# Patient Record
Sex: Female | Born: 1990 | Race: White | Hispanic: No | Marital: Single | Smoking: Never smoker
Health system: Southern US, Community
[De-identification: ages and names within clinical notes are randomized; demographics above are authoritative.]

## PROBLEM LIST (undated history)

## (undated) ENCOUNTER — Inpatient Hospital Stay: Payer: Self-pay

## (undated) DIAGNOSIS — O24419 Gestational diabetes mellitus in pregnancy, unspecified control: Secondary | ICD-10-CM

---
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
## 2014-03-20 LAB — OB RESULTS CONSOLE GBS
GBS: NEGATIVE
GBS: NEGATIVE
GBS: NEGATIVE
STREP GROUP B AG: NEGATIVE

## 2014-08-13 LAB — OB RESULTS CONSOLE GC/CHLAMYDIA
CHLAMYDIA, DNA PROBE: NEGATIVE
Gonorrhea: NEGATIVE

## 2014-08-13 LAB — OB RESULTS CONSOLE RUBELLA ANTIBODY, IGM: Rubella: IMMUNE

## 2014-08-13 LAB — OB RESULTS CONSOLE RPR: RPR: NONREACTIVE

## 2014-08-13 LAB — OB RESULTS CONSOLE HIV ANTIBODY (ROUTINE TESTING): HIV: NONREACTIVE

## 2014-08-13 LAB — OB RESULTS CONSOLE HEPATITIS B SURFACE ANTIGEN: Hepatitis B Surface Ag: NEGATIVE

## 2014-08-13 LAB — OB RESULTS CONSOLE VARICELLA ZOSTER ANTIBODY, IGG: VARICELLA IGG: IMMUNE

## 2015-01-19 LAB — OB RESULTS CONSOLE HIV ANTIBODY (ROUTINE TESTING): HIV: NONREACTIVE

## 2015-01-19 LAB — OB RESULTS CONSOLE RPR: RPR: NONREACTIVE

## 2015-01-24 ENCOUNTER — Observation Stay
Admission: EM | Admit: 2015-01-24 | Discharge: 2015-01-24 | Disposition: A | Discharge status: Home / Self Care | Payer: Medicaid Other | Attending: Obstetrics and Gynecology | Admitting: Obstetrics and Gynecology

## 2015-01-24 DIAGNOSIS — Z3A30 30 weeks gestation of pregnancy: Secondary | ICD-10-CM | POA: Insufficient documentation

## 2015-01-24 DIAGNOSIS — O42913 Preterm premature rupture of membranes, unspecified as to length of time between rupture and onset of labor, third trimester: Secondary | ICD-10-CM | POA: Diagnosis not present

## 2015-01-24 NOTE — OB Triage Note (Signed)
Pt arrives with c/o leaking "sticky fluid" since Thursday eve. Denies bleeding, pain.

## 2015-01-24 NOTE — Discharge Summary (Signed)
Obstetric History and Physical  Tara Romero is a 24 y.o. G1P0 with Estimated Date of Delivery: 04/04/15 per LMP and 8 wk US who presents at 9846w0d  presenting for leaking fluid for 4 days. Reports no large gush of fluid, but her underwear continuously getting wet. Denies vaginal itching or odor. Patient states she has been having some cramping, denies strong contractions,  no vaginal bleeding, with active fetal movement.    Prenatal Course Source of Care: [facility]  with onset of care at 6 weeks Pregnancy complications or risks: Patient Active Problem List   Diagnosis Date Noted  . Labor and delivery indication for care or intervention 01/24/2015     Prenatal labs and studies: ABO, Rh: AB+/RI/VI   Prenatal Transfer Tool   No past medical history on file.  No past surgical history on file.  OB History  Gravida Para Term Preterm AB SAB TAB Ectopic Multiple Living  1             # Outcome Date GA Lbr Len/2nd Weight Sex Delivery Anes PTL Lv  1 Current               History   Social History  . Marital Status: Single    Spouse Name: N/A  . Number of Children: N/A  . Years of Education: N/A   Social History Main Topics  . Smoking status: denies  . Smokeless tobacco: Not on file  . Alcohol Use: denies  . Drug Use: denies  . Sexual Activity: Not on file     No family history on file.  Prescriptions prior to admission  Medication Sig Dispense Refill Last Dose  . Prenatal Vit-Fe Fumarate-FA (MULTIVITAMIN-PRENATAL) 27-0.8 MG TABS tablet Take 1 tablet by mouth daily at 12 noon.   01/23/2015 at Unknown time    No Known Allergies  Review of Systems: Negative except for what is mentioned in HPI.  Physical Exam: BP 115/59 mmHg  Pulse 89  Temp(Src) 97.8 F (36.6 C) (Oral)  Resp 18  LMP 06/28/2014 GENERAL: Well-developed, well-nourished female in no acute distress.  ABDOMEN: Soft, nontender, nondistended, gravid. EXTREMITIES: Nontender, no edema Cervical Exam: Dilatation  1 cm   Effacement 30 %   Station -3   PELVIC EXAM: negative for ferning, nitrazine or pooling, wet mount + yeast, adherent curd-like discharge noted in vaginal vault Presentation: cephalic FHT: Category:1 Baseline rate 135 bpm   Variability moderate  Accelerations present   Decelerations none Contractions: rare   Pertinent Labs/Studies:   No results found for this or any previous visit (from the past 24 hour(s)).  Assessment : IUP at 6846w0d, no evidence of PPROM, yeast vaginitis  Plan: Rx for Terazol 3 given Preterm labor precautions. Given absence of contractions on fetal monitor during evaluation, low suspicion of preterm labor. Will have pt f/u at office this week. Encouraged hydration. Rec pelvic rest x 1 week at least. 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  Discharge Home

## 2015-03-19 LAB — OB RESULTS CONSOLE GBS: GBS: NEGATIVE

## 2015-04-08 ENCOUNTER — Inpatient Hospital Stay: Payer: Medicaid Other | Admitting: Anesthesiology

## 2015-04-08 ENCOUNTER — Inpatient Hospital Stay
Admission: EM | Admit: 2015-04-08 | Discharge: 2015-04-10 | DRG: 765 | Disposition: A | Discharge status: Home / Self Care | Payer: Medicaid Other | Attending: Obstetrics and Gynecology | Admitting: Obstetrics and Gynecology

## 2015-04-08 ENCOUNTER — Encounter
Admission: EM | Disposition: A | Discharge status: Home / Self Care | Payer: Self-pay | Source: Home / Self Care | Attending: Obstetrics and Gynecology

## 2015-04-08 DIAGNOSIS — Z3A40 40 weeks gestation of pregnancy: Secondary | ICD-10-CM | POA: Diagnosis present

## 2015-04-08 DIAGNOSIS — O9081 Anemia of the puerperium: Secondary | ICD-10-CM | POA: Diagnosis not present

## 2015-04-08 DIAGNOSIS — O480 Post-term pregnancy: Secondary | ICD-10-CM | POA: Diagnosis present

## 2015-04-08 DIAGNOSIS — O163 Unspecified maternal hypertension, third trimester: Secondary | ICD-10-CM | POA: Diagnosis present

## 2015-04-08 DIAGNOSIS — O133 Gestational [pregnancy-induced] hypertension without significant proteinuria, third trimester: Secondary | ICD-10-CM | POA: Diagnosis present

## 2015-04-08 DIAGNOSIS — D62 Acute posthemorrhagic anemia: Secondary | ICD-10-CM | POA: Diagnosis not present

## 2015-04-08 LAB — COMPREHENSIVE METABOLIC PANEL
ALBUMIN: 3.3 g/dL — AB (ref 3.5–5.0)
ALK PHOS: 126 U/L (ref 38–126)
ALT: 18 U/L (ref 14–54)
AST: 30 U/L (ref 15–41)
Anion gap: 9 (ref 5–15)
BILIRUBIN TOTAL: 0.4 mg/dL (ref 0.3–1.2)
BUN: 7 mg/dL (ref 6–20)
CO2: 22 mmol/L (ref 22–32)
CREATININE: 0.5 mg/dL (ref 0.44–1.00)
Calcium: 9 mg/dL (ref 8.9–10.3)
Chloride: 108 mmol/L (ref 101–111)
GFR calc Af Amer: >60 mL/min (ref >60)
Glucose, Bld: 83 mg/dL (ref 65–99)
POTASSIUM: 3.7 mmol/L (ref 3.5–5.1)
Sodium: 139 mmol/L (ref 135–145)
TOTAL PROTEIN: 6.8 g/dL (ref 6.5–8.1)

## 2015-04-08 LAB — CBC
HCT: 38.5 % (ref 35.0–47.0)
Hemoglobin: 13.3 g/dL (ref 12.0–16.0)
MCH: 31.2 pg (ref 26.0–34.0)
MCHC: 34.7 g/dL (ref 32.0–36.0)
MCV: 89.9 fL (ref 80.0–100.0)
PLATELETS: 151 10*3/uL (ref 150–440)
RBC: 4.28 MIL/uL (ref 3.80–5.20)
RDW: 12.4 % (ref 11.5–14.5)
WBC: 16.4 10*3/uL — ABNORMAL HIGH (ref 3.6–11.0)

## 2015-04-08 LAB — PROTEIN / CREATININE RATIO, URINE
Creatinine, Urine: 77 mg/dL
Protein Creatinine Ratio: 0.09 mg/mg{Cre} (ref 0.00–0.15)
Total Protein, Urine: 7 mg/dL

## 2015-04-08 LAB — TYPE AND SCREEN
ABO/RH(D): AB POS
Antibody Screen: NEGATIVE

## 2015-04-08 LAB — ABO/RH: ABO/RH(D): AB POS

## 2015-04-08 SURGERY — Surgical Case
Anesthesia: Epidural | Wound class: Clean Contaminated

## 2015-04-08 MED ORDER — DEXTROSE 5 % IV SOLN
1.0000 ug/kg/h | INTRAVENOUS | Status: DC | PRN
  Filled 2015-04-08: qty 2

## 2015-04-08 MED ORDER — ACETAMINOPHEN 325 MG PO TABS: 650.0000 mg | ORAL_TABLET | ORAL | Status: DC | PRN

## 2015-04-08 MED ORDER — OXYTOCIN 40 UNITS IN LACTATED RINGERS INFUSION - SIMPLE MED: 62.5000 mL/h | INTRAVENOUS | Status: DC

## 2015-04-08 MED ORDER — BUPIVACAINE HCL (PF) 0.5 % IJ SOLN
INTRAMUSCULAR | Status: AC
  Filled 2015-04-08: qty 30

## 2015-04-08 MED ORDER — DIPHENHYDRAMINE HCL 25 MG PO CAPS: 25.0000 mg | ORAL_CAPSULE | ORAL | Status: DC | PRN

## 2015-04-08 MED ORDER — SODIUM CHLORIDE 0.9 % IV SOLN
10000.0000 ug | INTRAVENOUS | Status: DC | PRN
  Administered 2015-04-08: 100 ug via INTRAVENOUS

## 2015-04-08 MED ORDER — MEPERIDINE HCL 25 MG/ML IJ SOLN
6.2500 mg | INTRAMUSCULAR | Status: DC | PRN
  Administered 2015-04-08: 6.25 mg via INTRAVENOUS

## 2015-04-08 MED ORDER — BUPIVACAINE HCL 0.5 % IJ SOLN
10.0000 mL | Freq: Once | INTRAMUSCULAR | Status: DC
  Filled 2015-04-08: qty 10

## 2015-04-08 MED ORDER — LIDOCAINE-EPINEPHRINE (PF) 1.5 %-1:200000 IJ SOLN
INTRAMUSCULAR | Status: DC | PRN
  Administered 2015-04-08: 3 mL via EPIDURAL

## 2015-04-08 MED ORDER — ONDANSETRON HCL 4 MG/2ML IJ SOLN
4.0000 mg | Freq: Three times a day (TID) | INTRAMUSCULAR | Status: DC | PRN
  Administered 2015-04-08: 4 mg via INTRAVENOUS
  Filled 2015-04-08: qty 2

## 2015-04-08 MED ORDER — DIPHENHYDRAMINE HCL 50 MG/ML IJ SOLN: 12.5000 mg | INTRAMUSCULAR | Status: DC | PRN

## 2015-04-08 MED ORDER — BUPIVACAINE 0.25 % ON-Q PUMP DUAL CATH 400 ML
400.0000 mL | INJECTION | Status: DC
  Filled 2015-04-08: qty 400

## 2015-04-08 MED ORDER — CITRIC ACID-SODIUM CITRATE 334-500 MG/5ML PO SOLN
ORAL | Status: AC
Start: 2015-04-08 — End: 2015-04-08
  Administered 2015-04-08: 30 mL via ORAL
  Filled 2015-04-08: qty 15

## 2015-04-08 MED ORDER — NALBUPHINE HCL 10 MG/ML IJ SOLN
5.0000 mg | Freq: Once | INTRAMUSCULAR | Status: DC | PRN
  Filled 2015-04-08: qty 0.5

## 2015-04-08 MED ORDER — TERBUTALINE SULFATE 1 MG/ML IJ SOLN
INTRAMUSCULAR | Status: AC
  Filled 2015-04-08: qty 1

## 2015-04-08 MED ORDER — TERBUTALINE SULFATE 1 MG/ML IJ SOLN: 0.2500 mg | Freq: Once | INTRAMUSCULAR | Status: DC | PRN

## 2015-04-08 MED ORDER — MEPERIDINE HCL 25 MG/ML IJ SOLN
INTRAMUSCULAR | Status: AC
  Administered 2015-04-08: 6.25 mg via INTRAVENOUS
  Filled 2015-04-08: qty 1

## 2015-04-08 MED ORDER — NALBUPHINE HCL 10 MG/ML IJ SOLN
5.0000 mg | INTRAMUSCULAR | Status: DC | PRN
  Filled 2015-04-08: qty 0.5

## 2015-04-08 MED ORDER — BUPIVACAINE HCL (PF) 0.25 % IJ SOLN
INTRAMUSCULAR | Status: DC | PRN
  Administered 2015-04-08: 2 mL via EPIDURAL
  Administered 2015-04-08 (×2): 2.5 mL via EPIDURAL
  Administered 2015-04-08: 2 mL via EPIDURAL

## 2015-04-08 MED ORDER — KETOROLAC TROMETHAMINE 30 MG/ML IJ SOLN: 30.0000 mg | Freq: Four times a day (QID) | INTRAMUSCULAR | Status: AC | PRN

## 2015-04-08 MED ORDER — LACTATED RINGERS IV SOLN
INTRAVENOUS | Status: DC
  Administered 2015-04-08: 20:00:00 via INTRAVENOUS
  Administered 2015-04-08 (×2): 1000 mL via INTRAVENOUS
  Administered 2015-04-09: via INTRAVENOUS

## 2015-04-08 MED ORDER — BUPIVACAINE 0.5 % ON-Q PUMP DUAL CATH 300 ML
INJECTION | Status: DC | PRN
  Administered 2015-04-08: 30 mL

## 2015-04-08 MED ORDER — LIDOCAINE HCL (PF) 1 % IJ SOLN
INTRAMUSCULAR | Status: AC
  Filled 2015-04-08: qty 30

## 2015-04-08 MED ORDER — AMMONIA AROMATIC IN INHA
RESPIRATORY_TRACT | Status: AC
  Filled 2015-04-08: qty 10

## 2015-04-08 MED ORDER — NALOXONE HCL 0.4 MG/ML IJ SOLN: 0.4000 mg | INTRAMUSCULAR | Status: DC | PRN

## 2015-04-08 MED ORDER — KETOROLAC TROMETHAMINE 30 MG/ML IJ SOLN
30.0000 mg | Freq: Four times a day (QID) | INTRAMUSCULAR | Status: AC | PRN
  Administered 2015-04-09 (×2): 30 mg via INTRAVENOUS
  Filled 2015-04-08 (×3): qty 1

## 2015-04-08 MED ORDER — FENTANYL CITRATE (PF) 100 MCG/2ML IJ SOLN
INTRAMUSCULAR | Status: DC | PRN
  Administered 2015-04-08: 20 ug via INTRAVENOUS

## 2015-04-08 MED ORDER — BUPIVACAINE HCL (PF) 0.75 % IJ SOLN
INTRAMUSCULAR | Status: DC | PRN
  Administered 2015-04-08: 11 mg

## 2015-04-08 MED ORDER — OXYTOCIN BOLUS FROM INFUSION: 500.0000 mL | INTRAVENOUS | Status: DC

## 2015-04-08 MED ORDER — MORPHINE SULFATE (PF) 0.5 MG/ML IJ SOLN
INTRAMUSCULAR | Status: DC | PRN
  Administered 2015-04-08: .2 mg via EPIDURAL

## 2015-04-08 MED ORDER — SODIUM CHLORIDE 0.9 % IJ SOLN: 3.0000 mL | INTRAMUSCULAR | Status: DC | PRN

## 2015-04-08 MED ORDER — CITRIC ACID-SODIUM CITRATE 334-500 MG/5ML PO SOLN
30.0000 mL | ORAL | Status: AC
  Administered 2015-04-08: 30 mL via ORAL

## 2015-04-08 MED ORDER — CEFAZOLIN SODIUM-DEXTROSE 2-3 GM-% IV SOLR
2.0000 g | INTRAVENOUS | Status: AC
  Administered 2015-04-08: 2 g via INTRAVENOUS

## 2015-04-08 MED ORDER — SODIUM CHLORIDE 0.9 % IJ SOLN
INTRAMUSCULAR | Status: AC
  Filled 2015-04-08: qty 40

## 2015-04-08 MED ORDER — FENTANYL 2.5 MCG/ML W/ROPIVACAINE 0.2% IN NS 100 ML EPIDURAL INFUSION (ARMC-ANES): 10.0000 mL/h | EPIDURAL | Status: DC

## 2015-04-08 MED ORDER — MISOPROSTOL 200 MCG PO TABS
ORAL_TABLET | ORAL | Status: AC
  Filled 2015-04-08: qty 4

## 2015-04-08 MED ORDER — FENTANYL 2.5 MCG/ML W/ROPIVACAINE 0.2% IN NS 100 ML EPIDURAL INFUSION (ARMC-ANES)
EPIDURAL | Status: AC
  Administered 2015-04-08: 10 mL/h via EPIDURAL
  Filled 2015-04-08: qty 100

## 2015-04-08 MED ORDER — CEFAZOLIN SODIUM-DEXTROSE 2-3 GM-% IV SOLR
INTRAVENOUS | Status: AC
  Filled 2015-04-08: qty 50

## 2015-04-08 MED ORDER — LACTATED RINGERS IV SOLN: 500.0000 mL | INTRAVENOUS | Status: DC | PRN

## 2015-04-08 MED ORDER — OXYTOCIN 10 UNIT/ML IJ SOLN
INTRAMUSCULAR | Status: AC
  Filled 2015-04-08: qty 2

## 2015-04-08 MED ORDER — OXYTOCIN 40 UNITS IN LACTATED RINGERS INFUSION - SIMPLE MED
1.0000 m[IU]/min | INTRAVENOUS | Status: DC
  Filled 2015-04-08: qty 1000

## 2015-04-08 SURGICAL SUPPLY — 25 items
BAG COUNTER SPONGE EZ (MISCELLANEOUS) ×2 IMPLANT
CANISTER SUCT 3000ML (MISCELLANEOUS) ×3 IMPLANT
CATH KIT ON-Q SILVERSOAK 5IN (CATHETERS) ×6 IMPLANT
CHLORAPREP W/TINT 26ML (MISCELLANEOUS) ×6 IMPLANT
CLOSURE WOUND 1/2 X4 (GAUZE/BANDAGES/DRESSINGS) ×1
COUNTER SPONGE BAG EZ (MISCELLANEOUS) ×1
DRSG TELFA 3X8 NADH (GAUZE/BANDAGES/DRESSINGS) ×3 IMPLANT
ELECT CAUTERY BLADE 6.4 (BLADE) ×3 IMPLANT
GAUZE SPONGE 4X4 12PLY STRL (GAUZE/BANDAGES/DRESSINGS) ×3 IMPLANT
GLOVE BIO SURGEON STRL SZ7 (GLOVE) ×12 IMPLANT
GLOVE INDICATOR 7.5 STRL GRN (GLOVE) ×12 IMPLANT
GOWN STRL REUS W/ TWL LRG LVL3 (GOWN DISPOSABLE) ×3 IMPLANT
GOWN STRL REUS W/TWL LRG LVL3 (GOWN DISPOSABLE) ×6
LIQUID BAND (GAUZE/BANDAGES/DRESSINGS) ×3 IMPLANT
NS IRRIG 1000ML POUR BTL (IV SOLUTION) ×3 IMPLANT
PACK C SECTION AR (MISCELLANEOUS) ×3 IMPLANT
PAD GROUND ADULT SPLIT (MISCELLANEOUS) ×3 IMPLANT
PAD OB MATERNITY 4.3X12.25 (PERSONAL CARE ITEMS) ×3 IMPLANT
PAD PREP 24X41 OB/GYN DISP (PERSONAL CARE ITEMS) ×3 IMPLANT
STRIP CLOSURE SKIN 1/2X4 (GAUZE/BANDAGES/DRESSINGS) ×2 IMPLANT
SUT MNCRL AB 4-0 PS2 18 (SUTURE) ×3 IMPLANT
SUT PDS AB 1 TP1 96 (SUTURE) ×6 IMPLANT
SUT VIC AB 0 CTX 36 (SUTURE) ×4
SUT VIC AB 0 CTX36XBRD ANBCTRL (SUTURE) ×2 IMPLANT
SUT VIC AB 2-0 CT1 36 (SUTURE) ×3 IMPLANT

## 2015-04-08 NOTE — Anesthesia Preprocedure Evaluation (Signed)
Anesthesia Evaluation  Patient identified by MRN, date of birth, ID band Patient awake    Reviewed: Allergy & Precautions, H&P , NPO status , Patient's Chart, lab work & pertinent test results, reviewed documented beta blocker date and time   History of Anesthesia Complications Negative for: history of anesthetic complications  Airway Mallampati: II  TM Distance: >3 FB Neck ROM: full    Dental no notable dental hx.    Pulmonary neg pulmonary ROS,    Pulmonary exam normal breath sounds clear to auscultation       Cardiovascular Exercise Tolerance: Good negative cardio ROS Normal cardiovascular exam Rhythm:regular Rate:Normal     Neuro/Psych negative neurological ROS  negative psych ROS   GI/Hepatic negative GI ROS, Neg liver ROS,   Endo/Other  negative endocrine ROS  Renal/GU negative Renal ROS  negative genitourinary   Musculoskeletal   Abdominal   Peds  Hematology negative hematology ROS (+)   Anesthesia Other Findings History reviewed. No pertinent past medical history.   Reproductive/Obstetrics negative OB ROS                             Anesthesia Physical Anesthesia Plan  ASA: II  Anesthesia Plan: Epidural   Post-op Pain Management:    Induction:   Airway Management Planned:   Additional Equipment:   Intra-op Plan:   Post-operative Plan:   Informed Consent: I have reviewed the patients History and Physical, chart, labs and discussed the procedure including the risks, benefits and alternatives for the proposed anesthesia with the patient or authorized representative who has indicated his/her understanding and acceptance.   Dental Advisory Given  Plan Discussed with: Anesthesiologist, CRNA and Surgeon  Anesthesia Plan Comments:         Anesthesia Quick Evaluation

## 2015-04-08 NOTE — Anesthesia Procedure Notes (Addendum)
Epidural  Start time: 04/08/2015 12:00 PM End time: 04/08/2015 12:25 PM  Staffing Anesthesiologist: Martha Clan Performed by: anesthesiologist   Preanesthetic Checklist Completed: patient identified, site marked, surgical consent, pre-op evaluation, timeout performed, IV checked, risks and benefits discussed and monitors and equipment checked  Epidural Patient position: sitting Prep: ChloraPrep Patient monitoring: heart rate, cardiac monitor, continuous pulse ox and blood pressure Approach: midline Location: L3-L4 Injection technique: LOR saline  Needle:  Needle type: Tuohy  Needle gauge: 18 G Needle length: 9 cm Needle insertion depth: 6 cm Catheter type: closed end Catheter size: 20 Guage Catheter at skin depth: 10.5 cm Test dose: negative  Additional Notes Reason for block:procedure for pain  Spinal Patient location during procedure: OB Staffing Performed by: anesthesiologist  Preanesthetic Checklist Completed: patient identified, site marked, surgical consent, pre-op evaluation, timeout performed, IV checked, risks and benefits discussed and monitors and equipment checked Spinal Block Patient position: sitting Prep: Betadine Patient monitoring: heart rate, continuous pulse ox, blood pressure and cardiac monitor Approach: midline Location: L4-5 Injection technique: single-shot Needle Needle type: Whitacre and Introducer  Needle gauge: 27 G Needle length: 9 cm Assessment Sensory level: T4 Additional Notes Negative paresthesia. Negative blood return. Positive free-flowing CSF. Expiration date of kit checked and confirmed. Patient tolerated procedure well, without complications.JA

## 2015-04-08 NOTE — Progress Notes (Signed)
CS callled

## 2015-04-08 NOTE — Progress Notes (Signed)
Rechecked no change in station, caput, now 2hrs 50 minutes of pushing.  Discussed lack of progress and next steps.  Fetal well being reassuring I suspect we may be slightly asynclitic.  Will proceed with 1LTCS for delivery.

## 2015-04-08 NOTE — Progress Notes (Signed)
Cervix still visually closed, contraction irregular q66min per patient.  Discharge given no cervical change, afebrile, fundus non-tender.  Patient has follow up 04/12/15 will add on cervical length and AFI [place].

## 2015-04-08 NOTE — Op Note (Signed)
Preoperative Diagnosis: 1) 24 y.o. G1P1001 at [redacted]w[redacted]d 2) Second stage arrest  Postoperative Diagnosis: 1) 24 y.o. G1P1001 at [redacted]w[redacted]d 2) Second stage arrest 3) Meconium  Operation Performed: Primary low transverse C-section via pfannenstiel skin incision  Indication: 2nd stage arrest after 3-hrs of pushing without significant progress  Anesthesia: Spinal  Primary Surgeon: Vena Austria, MD  Assistant: Ranae Plumber, MD  Preoperative Antibiotics: 2g ancef  Estimated Blood Loss:  IV Fluids:  Urine Output::  Drains or Tubes: Foley to gravity drainage, ON-Q catheter system  Implants: none  Specimens Removed: none  Complications: none  Intraoperative Findings:   Normal tubes ovaries and uterus.   Delivery resulted in the birth of a liveborn female infant APGAR (1 MIN): 1 APGAR (5 MINS):  6 APGAR (10 MINS): 8 Weight 6lbs 11oz  Patient Condition: stable  Procedure in Detail:  Patient was taken to the operating room were she was administered regional anesthesia.  She was positioned in the supine position, prepped and draped in the  Usual sterile fashion.  Prior to proceeding with the case a time out was performed and the level of anesthetic was checked and noted to be adequate.  Utilizing the scalpel a pfannenstiel skin incision was made 2cm above the pubic symphysis and carried down sharply to the the level of the rectus fascia.  The fascia was incised in the midline using the scalpel and then extended using mayo scissors.  The superior border of the rectus fascia was grasped with two Kocher clamps and the underlying rectus muscles were dissected of the fascia using blunt dissection.  The median raphae was incised using Mayo scissors.   The inferior border of the rectus fascia was dissected of the rectus muscles in a similar fashion.  The midline was identified, the peritoneum was entered bluntly and expanded using manual tractions.  The uterus was noted to be  in a none rotated position.  Next the bladder blade was placed retracting the bladder caudally.  A bladder flap was created.  The bladder reflection was grasped with a pickup, and Metzenbaum scissors were then used the undermine the bladder reflection.  The bladder flap was developed using digital dissection.  The bladder blade was replaced retracting the bladder caudally out of the operative field.A low transverse incision was scored on the lower uterine segment.  The hysterotomy was entered bluntly using the operators finger.  The hysterotomy incision was extended using manual traction.  The operators hand was placed within the hysterotomy position noting the fetus to be within the OA position.  The vertex was grasped, flexed, brought to the incision, and delivered a traumatically using fundal pressure.  The remainder of the body delivered with ease.  The infant was suctioned, cord was clamped and cut before handing off to the awaiting neonatologist.  The placenta was delivered using manual extraction.  The uterus was exteriorized, wiped clean of clots and debris using two moist laps.  The hysterotomy was closed using a two layer closure of 0 Vicryl, with the first being a running locked, the second a vertical imbricating.  The uterus was returned to the abdomen.  The peritoneal gutters were wiped clean of clots and debris using two moist laps.  The hysterotomy incision was re-inspected noted to be hemostatic.  The rectus muscles were re-approximated in the midline using a single 2-0 Vicryl mattress stitch.  The rectus muscles were inspected noted to be hemostatic.  The superior border of the rectus fascia was grasped  with a Kocher clamp.  The ON-Q trocars were then placed 4cm above the superior border of the incision and tunneled subfascially.  The introducers were removed and the catheters were threaded through the sleeves after which the sleeves were removed.  The fascia was closed using a looped #1 PDS in a  running fashion taking 1cm by 1cm bites.  The subcutaneous tissue was irrigated using warm saline, hemostasis achieved using the bovis.  The subcutaneous dead space was less than 3cm and was not closed.  The skinwas closed using 4-0 monocryl.   Sponge needle and instrument counts were corrects times two.  The patient tolerated the procedure well and was taken to the recovery room in stable condition.

## 2015-04-08 NOTE — H&P (Signed)
OB History & Physical   History of Present Illness:  Chief Complaint:  C/O contractions starting at 0100 this AM. No vaginal bleeding or leakage of fluid. Contractions starting to last longer and  HPI:  Tara Romero is a 24 y.o. G1P0 female with EDD at 04/04/2015 at [redacted]w[redacted]d dated by LMP=8wk ultrasound.  Her pregnancy has been uncomplicated.  She presented to L&D for evaluation of contractions and was noted to have elevated blood pressures. Prenatal care site: Prenatal care at [facility] has been remarkable for early prenatal care, a normal anatomy scan with EFW 5#4oz on 8/11 (23rd%), and had a 33# weight gain with pregnancy.      Maternal Medical History:  History reviewed. No pertinent past medical history.  History reviewed. No pertinent past surgical history.  No Known Allergies  Prior to Admission medications   Medication Sig Start Date End Date Taking? Authorizing Provider  Prenatal Vit-Fe Fumarate-FA (MULTIVITAMIN-PRENATAL) 27-0.8 MG TABS tablet Take 1 tablet by mouth daily at 12 noon.   Yes Historical Provider, MD          Social History: She  reports that she has never smoked. She has never used smokeless tobacco. She reports that she does not drink alcohol or use illicit drugs.  Family History: family history is not on file.   Review of Systems: Negative x 10 systems. Specifically denies headaches, CP, SOB, RUQ pain.    Physical Exam:  Vital Signs: BP 141/84 mmHg  Pulse 95  Temp(Src) 98.7 F (37.1 C)  Resp 22  Ht  (1.626 m)  Wt 161 lb (73.029 kg)  BMI 27.62 kg/m2  LMP 06/28/2014  Patient Vitals for the past 24 hrs:  BP Temp Pulse Resp Height Weight  04/08/15 0630 (!) 141/84 mmHg 98.7 F (37.1 C) 95 (!) 22 - -  04/08/15 0530 - - - -  (1.626 m) 161 lb (73.029 kg)  04/08/15 0501 136/85 mmHg - - - - -  04/08/15 0500 (!) 149/95 mmHg 98 F (36.7 C) - 18 - -   General: no acute distress.  HEENT: normocephalic, atraumatic Heart: regular rate &  rhythm.  No murmurs Lungs: clear to auscultation bilaterally Abdomen: soft, gravid, non-tender;  EFW: 7# Pelvic:   External: Normal external female genitalia  Cervix: 2cm per RN exam x2      Extremities: non-tender, symmetric, +1edema bilaterally.  DTRs:+2 to +3  Neurologic: Alert & oriented x 3.    Pertinent Results:  Prenatal Labs: Blood type/Rh AB positive  Antibody screen negative  Rubella Varicella Immune immune  RPR Non reactive  HBsAg negative  HIV negative  GC negative  Chlamydia negative  Genetic screening declines  1 hour GTT 113  3 hour GTT N/A  GBS negative on 8/29  FHR 130 with accelerations to 150 to 160 with moderate variability, no decels Toco: q2-5 min apart  Assessment:  Tara Romero is a 24 y.o. G1P0 female at [redacted]w[redacted]d with early vs prodromal labor Elevated blood pressures-R/O gestational htn vs preeclampsia Cat 1 fetal heart tracing      Plan:  1. Admit to observation.   2. PIH labs, RPR, T&S, pro/cr ratio 3. GBS negative   4. Monitor blood pressures 5. Monitor ctxs and for cervical change  Ashia Dehner  04/08/2015 7:14 AM

## 2015-04-08 NOTE — Transfer of Care (Signed)
Immediate Anesthesia Transfer of Care Note  Patient: Tara Romero  Procedure(s) Performed: Procedure(s): CESAREAN SECTION (N/A)  Patient Location: PACU  Anesthesia Type:Regional  Level of Consciousness: alert   Airway & Oxygen Therapy: Patient Spontanous Breathing  Post-op Assessment: Report given to RN and Post -op Vital signs reviewed and stable  Post vital signs: stable  Last Vitals:  Filed Vitals:   04/08/15 1913  BP: 140/88  Pulse: 121  Temp:   Resp:     Complications: No apparent anesthesia complications

## 2015-04-08 NOTE — OR Nursing (Signed)
DR. Bonney Aid placed an ON-Q Pump for pain in patient using 0.5% Sensorcaine

## 2015-04-08 NOTE — Progress Notes (Signed)
Subjective:  Pushing  Objective:   Vitals: Blood pressure 110/79, pulse 94, temperature 98.7 F (37.1 C), resp. rate 20, height  (1.626 m), weight 73.029 kg (161 lb), last menstrual period 06/28/2014. General: NA Cervical Exam:  Dilation: 10 Effacement (%): 100 Station: +1 Exam by:: gck  FHT: 140, moderate, +accels, no decels Toco: q67min  Results for orders placed or performed during the hospital encounter of 04/08/15 (from the past 24 hour(s))  Comprehensive metabolic panel     Status: Abnormal   Collection Time: 04/08/15  7:43 AM  Result Value Ref Range   Sodium 139 135 - 145 mmol/L   Potassium 3.7 3.5 - 5.1 mmol/L   Chloride 108 101 - 111 mmol/L   CO2 22 22 - 32 mmol/L   Glucose, Bld 83 65 - 99 mg/dL   BUN 7 6 - 20 mg/dL   Creatinine, Ser 3.08 0.44 - 1.00 mg/dL   Calcium 9.0 8.9 - 65.7 mg/dL   Total Protein 6.8 6.5 - 8.1 g/dL   Albumin 3.3 (L) 3.5 - 5.0 g/dL   AST 30 15 - 41 U/L   ALT 18 14 - 54 U/L   Alkaline Phosphatase 126 38 - 126 U/L   Total Bilirubin 0.4 0.3 - 1.2 mg/dL   GFR calc non Af Amer >60 >60 mL/min   GFR calc Af Amer >60 >60 mL/min   Anion gap 9 5 - 15  Type and screen     Status: None   Collection Time: 04/08/15  7:43 AM  Result Value Ref Range   ABO/RH(D) AB POS    Antibody Screen NEG    Sample Expiration 04/11/2015   CBC     Status: Abnormal   Collection Time: 04/08/15  7:43 AM  Result Value Ref Range   WBC 16.4 (H) 3.6 - 11.0 K/uL   RBC 4.28 3.80 - 5.20 MIL/uL   Hemoglobin 13.3 12.0 - 16.0 g/dL   HCT 84.6 96.2 - 95.2 %   MCV 89.9 80.0 - 100.0 fL   MCH 31.2 26.0 - 34.0 pg   MCHC 34.7 32.0 - 36.0 g/dL   RDW 84.1 32.4 - 40.1 %   Platelets 151 150 - 440 K/uL  Protein / creatinine ratio, urine     Status: None   Collection Time: 04/08/15  7:43 AM  Result Value Ref Range   Creatinine, Urine 77 mg/dL   Total Protein, Urine 7 mg/dL   Protein Creatinine Ratio 0.09 0.00 - 0.15 mg/mg[Cre]  ABO/Rh     Status: None   Collection Time:  04/08/15  7:44 AM  Result Value Ref Range   ABO/RH(D) AB POS     Assessment:   24 y.o. G1P0 [redacted]w[redacted]d term labor  Plan:   1) Labor - pushing since 16:10.  Adequate pelvis, approximately 7lbs baby  2) Fetus - cat I tracing - EFW at 36 weeks 5lbs 4oz 23.1%, by Le[place] about 7lbs - Weight gain 32lbs this pregnancy

## 2015-04-08 NOTE — Progress Notes (Signed)
S: Pt reporting continued painful contractions, denies ha, visual changes or RUQ pain  O: Cervix: 4/90/-1, category 1 tracing, ctx q 3-4 min. BP mild range (1 diastolic in severe range). Pre-eclampsia labs WNL.   A: IUP at [redacted]w[redacted]d, GHTN, early labor  P: Admission for labor, augment with Pitocin as needed Anticipate vaginal delivery.

## 2015-04-09 ENCOUNTER — Encounter: Payer: Self-pay | Admitting: Obstetrics and Gynecology

## 2015-04-09 LAB — RPR: RPR: NONREACTIVE

## 2015-04-09 LAB — CBC
HCT: 34.8 % — ABNORMAL LOW (ref 35.0–47.0)
Hemoglobin: 12 g/dL (ref 12.0–16.0)
MCH: 31.3 pg (ref 26.0–34.0)
MCHC: 34.5 g/dL (ref 32.0–36.0)
MCV: 90.7 fL (ref 80.0–100.0)
PLATELETS: 129 10*3/uL — AB (ref 150–440)
RBC: 3.84 MIL/uL (ref 3.80–5.20)
RDW: 12.4 % (ref 11.5–14.5)
WBC: 20.1 10*3/uL — AB (ref 3.6–11.0)

## 2015-04-09 MED ORDER — DIPHENHYDRAMINE HCL 25 MG PO CAPS: 25.0000 mg | ORAL_CAPSULE | Freq: Four times a day (QID) | ORAL | Status: DC | PRN

## 2015-04-09 MED ORDER — OXYTOCIN 40 UNITS IN LACTATED RINGERS INFUSION - SIMPLE MED: 1.0000 m[IU]/min | INTRAVENOUS | Status: DC

## 2015-04-09 MED ORDER — NALOXONE HCL 1 MG/ML IJ SOLN: 1.0000 ug/kg/h | INTRAVENOUS | Status: DC | PRN

## 2015-04-09 MED ORDER — ONDANSETRON HCL 4 MG/2ML IJ SOLN: 4.0000 mg | Freq: Three times a day (TID) | INTRAMUSCULAR | Status: DC | PRN

## 2015-04-09 MED ORDER — IBUPROFEN 600 MG PO TABS: 600.0000 mg | ORAL_TABLET | Freq: Four times a day (QID) | ORAL | Status: DC

## 2015-04-09 MED ORDER — HYDROCODONE-ACETAMINOPHEN 5-325 MG PO TABS
1.0000 | ORAL_TABLET | Freq: Four times a day (QID) | ORAL | Status: DC | PRN
  Administered 2015-04-09: 2 via ORAL
  Administered 2015-04-10 (×2): 1 via ORAL
  Filled 2015-04-09: qty 1
  Filled 2015-04-09: qty 2
  Filled 2015-04-09: qty 1

## 2015-04-09 MED ORDER — SODIUM CHLORIDE 0.9 % IJ SOLN: 3.0000 mL | INTRAMUSCULAR | Status: DC | PRN

## 2015-04-09 MED ORDER — TERBUTALINE SULFATE 1 MG/ML IJ SOLN: 0.2500 mg | Freq: Once | INTRAMUSCULAR | Status: DC | PRN

## 2015-04-09 MED ORDER — FENTANYL CITRATE (PF) 100 MCG/2ML IJ SOLN: 25.0000 ug | INTRAMUSCULAR | Status: DC | PRN

## 2015-04-09 MED ORDER — DIPHENHYDRAMINE HCL 50 MG/ML IJ SOLN: 12.5000 mg | INTRAMUSCULAR | Status: DC | PRN

## 2015-04-09 MED ORDER — OXYTOCIN 40 UNITS IN LACTATED RINGERS INFUSION - SIMPLE MED
62.5000 mL/h | INTRAVENOUS | Status: AC
Start: 2015-04-09 — End: 2015-04-09

## 2015-04-09 MED ORDER — SIMETHICONE 80 MG PO CHEW
80.0000 mg | CHEWABLE_TABLET | ORAL | Status: DC | PRN
  Administered 2015-04-10: 80 mg via ORAL
  Filled 2015-04-09: qty 1

## 2015-04-09 MED ORDER — MEPERIDINE HCL 25 MG/ML IJ SOLN: 6.2500 mg | INTRAMUSCULAR | Status: DC | PRN

## 2015-04-09 MED ORDER — NALBUPHINE HCL 10 MG/ML IJ SOLN: 5.0000 mg | Freq: Once | INTRAMUSCULAR | Status: DC | PRN

## 2015-04-09 MED ORDER — SENNOSIDES-DOCUSATE SODIUM 8.6-50 MG PO TABS: 2.0000 | ORAL_TABLET | ORAL | Status: DC

## 2015-04-09 MED ORDER — ONDANSETRON HCL 4 MG/2ML IJ SOLN: 4.0000 mg | Freq: Once | INTRAMUSCULAR | Status: DC | PRN

## 2015-04-09 MED ORDER — HYDROMORPHONE HCL 1 MG/ML IJ SOLN: 0.2500 mg | INTRAMUSCULAR | Status: DC | PRN

## 2015-04-09 MED ORDER — IBUPROFEN 600 MG PO TABS
600.0000 mg | ORAL_TABLET | Freq: Four times a day (QID) | ORAL | Status: DC | PRN
  Administered 2015-04-09 – 2015-04-10 (×2): 600 mg via ORAL
  Filled 2015-04-09 (×3): qty 1

## 2015-04-09 MED ORDER — SCOPOLAMINE 1 MG/3DAYS TD PT72
1.0000 | MEDICATED_PATCH | Freq: Once | TRANSDERMAL | Status: DC
Start: 2015-04-09 — End: 2015-04-10

## 2015-04-09 MED ORDER — SIMETHICONE 80 MG PO CHEW: 80.0000 mg | CHEWABLE_TABLET | ORAL | Status: DC

## 2015-04-09 MED ORDER — LANOLIN HYDROUS EX OINT: 1.0000 "application " | TOPICAL_OINTMENT | CUTANEOUS | Status: DC | PRN

## 2015-04-09 MED ORDER — NALBUPHINE HCL 10 MG/ML IJ SOLN: 5.0000 mg | INTRAMUSCULAR | Status: DC | PRN

## 2015-04-09 MED ORDER — DIBUCAINE 1 % RE OINT: 1.0000 "application " | TOPICAL_OINTMENT | RECTAL | Status: DC | PRN

## 2015-04-09 MED ORDER — NALOXONE HCL 0.4 MG/ML IJ SOLN: 0.4000 mg | INTRAMUSCULAR | Status: DC | PRN

## 2015-04-09 MED ORDER — SIMETHICONE 80 MG PO CHEW
80.0000 mg | CHEWABLE_TABLET | Freq: Three times a day (TID) | ORAL | Status: DC
  Administered 2015-04-09 – 2015-04-10 (×4): 80 mg via ORAL
  Filled 2015-04-09 (×4): qty 1

## 2015-04-09 MED ORDER — DIPHENHYDRAMINE HCL 25 MG PO CAPS: 25.0000 mg | ORAL_CAPSULE | ORAL | Status: DC | PRN

## 2015-04-09 MED ORDER — ACETAMINOPHEN 325 MG PO TABS: 650.0000 mg | ORAL_TABLET | ORAL | Status: DC | PRN

## 2015-04-09 MED ORDER — WITCH HAZEL-GLYCERIN EX PADS: 1.0000 "application " | MEDICATED_PAD | CUTANEOUS | Status: DC | PRN

## 2015-04-09 MED ORDER — PRENATAL MULTIVITAMIN CH
1.0000 | ORAL_TABLET | Freq: Every day | ORAL | Status: DC
  Administered 2015-04-09 – 2015-04-10 (×2): 1 via ORAL
  Filled 2015-04-09 (×2): qty 1

## 2015-04-09 MED ORDER — LACTATED RINGERS IV SOLN
INTRAVENOUS | Status: DC
  Administered 2015-04-09: 10:00:00 via INTRAVENOUS

## 2015-04-09 MED ORDER — MENTHOL 3 MG MT LOZG: 1.0000 | LOZENGE | OROMUCOSAL | Status: DC | PRN

## 2015-04-09 NOTE — Anesthesia Post-op Follow-up Note (Signed)
  Anesthesia Pain Follow-up Note  Patient: Tara Romero  Day #: 1  Date of Follow-up: 04/09/2015 Time: 10:50 AM  Last Vitals:  Filed Vitals:   04/09/15 0911  BP: 109/56  Pulse: 77  Temp: 36.8 C  Resp: 18    Level of Consciousness: alert  Pain: none   Side Effects:None  Catheter Site Exam:clean, dry, no drainage  Plan: D/C from anesthesia care  Chong Sicilian

## 2015-04-09 NOTE — Anesthesia Postprocedure Evaluation (Signed)
  Anesthesia Post-op Note  Patient: Tara Romero  Procedure(s) Performed: Procedure(s): CESAREAN SECTION (N/A)  Anesthesia type:Epidural  Patient location: 345  Post pain: Pain level controlled  Post assessment: Post-op Vital signs reviewed, Patient's Cardiovascular Status Stable, Respiratory Function Stable, Patent Airway and No signs of Nausea or vomiting  Post vital signs: Reviewed and stable  Last Vitals:  Filed Vitals:   04/09/15 0911  BP: 109/56  Pulse: 77  Temp: 36.8 C  Resp: 18    Level of consciousness: awake, alert  and patient cooperative  Complications: No apparent anesthesia complications

## 2015-04-09 NOTE — Progress Notes (Signed)
  Subjective:   Doing well, ambulating, tolerating regular PO diet, tolerating pain with PO meds. Foley catheter still in place.  NO CP, SOB, Fever/chills, nausea/vomiting, or calf pain.  Objective:  Blood pressure 121/76, pulse 91, temperature 98.5 F (36.9 C) Oral, resp. rate 17, SpO2 98 %,  currently breastfeeding.  General: NAD Pulmonary: no increased work of breathing Abdomen: non-distended, non-tender, fundus firm at level of umbilicus Incision: bandage is c/d/i, on -q pump in place, catheter sites c/d/i Extremities: no edema, no erythema, no tenderness  Results for orders placed or performed during the hospital encounter of 04/08/15 (from the past 24 hour(s))  CBC     Status: Abnormal   Collection Time: 04/09/15  6:47 AM  Result Value Ref Range   WBC 20.1 (H) 3.6 - 11.0 K/uL   RBC 3.84 3.80 - 5.20 MIL/uL   Hemoglobin 12.0 12.0 - 16.0 g/dL   HCT 16.1 (L) 09.6 - 04.5 %   MCV 90.7 80.0 - 100.0 fL   MCH 31.3 26.0 - 34.0 pg   MCHC 34.5 32.0 - 36.0 g/dL   RDW 40.9 81.1 - 91.4 %   Platelets 129 (L) 150 - 440 K/uL   UOP: 850 since delivery    Assessment:   24 y.o. G1P1001 postoperativeday # 1 from pLTCS for failure to descend.   Plan:  1) Acute blood loss anemia - hemodynamically stable and asymptomatic - po ferrous sulfate  2)post op:  Doing well, remove foley, get OOB, encourage ambulation.  Reg diet next meal  3) Lactation: continue support, nervous about ability.  4) Breast feeding; contraception: Mirena ordered for 6w PP  5) Disposition: continue inpatient management  ----- Ranae Plumber, MD Attending Obstetrician and Gynecologist [facility]

## 2015-04-10 MED ORDER — IBUPROFEN 600 MG PO TABS: 600.0000 mg | ORAL_TABLET | Freq: Four times a day (QID) | ORAL | Status: DC | PRN

## 2015-04-10 MED ORDER — INFLUENZA VAC SPLIT QUAD 0.5 ML IM SUSY
0.5000 mL | PREFILLED_SYRINGE | INTRAMUSCULAR | Status: DC
Start: 2015-04-11 — End: 2015-04-10

## 2015-04-10 MED ORDER — HYDROCODONE-ACETAMINOPHEN 5-325 MG PO TABS: 1.0000 | ORAL_TABLET | Freq: Four times a day (QID) | ORAL | Status: DC | PRN

## 2015-04-10 NOTE — Discharge Instructions (Signed)
Discharge instructions:  ° °Call office if you have any of the following: headache, visual changes, fever >100 F, chills, breast concerns, excessive vaginal bleeding, incision drainage or problems, leg pain or redness, depression or any other concerns.  ° °Activity: Do not lift > 10 lbs for 6 weeks.  °No intercourse or tampons for 6 weeks.  °No driving for 1-2 weeks.  ° °

## 2015-04-10 NOTE — Discharge Summary (Signed)
Obstetric Discharge Summary Reason for Admission: Labor, GHTN Delivery Type: primary cesarean section, low transverse incision for 2nd stage arrest Postpartum Procedures: none Complications-Intrapartum or Postpartum: none    Recent Labs  04/08/15 0743 04/09/15 0647  HGB 13.3 12.0  HCT 38.5 34.8*      Gestational Age at Delivery: [redacted]w[redacted]d  Antepartum complications: none Date of Delivery: 04/08/15  Delivered By: Dr Bonney Aid, Dr Ward assisting Delivery Type: primary cesarean section, low transverse incision  Physical Exam:  General: alert and cooperative Lochia: appropriate Uterine Fundus: firm Incision: healing well, no significant drainage, On-Q dressing intact DVT Evaluation: No evidence of DVT seen on physical exam. Abdomen: abdomen is soft without significant tenderness, masses, organomegaly or guarding  Prenatal Labs Blood Type: AB+ Rubella: Immune Varicella: Immune TDAP: Given during pregnancy Contraception: Mirena  Discharge Diagnoses: Term Pregnancy-delivered  Discharge Information: Date: 04/10/2015 Activity: pelvic rest Diet: routine Medications: PNV, Ibuprofen and Vicodin Condition: stable Instructions:  Discharge instructions:   Call office if you have any of the following: headache, visual changes, fever >100 F, chills, breast concerns, excessive vaginal bleeding, incision drainage or problems, leg pain or redness, depression or any other concerns.   Activity: Do not lift > 10 lbs for 6 weeks.  No intercourse or tampons for 6 weeks.  No driving for 1-2 weeks.   Discharge to: home Follow-up Information    Follow up with [facility]. Schedule an appointment as soon as possible for a visit in 1 week.   Why:  For wound re-check      Follow up with [facility]. Schedule an appointment as soon as possible for a visit in 6 weeks.   Why:  for postpartum visit and IUD insertion      Newborn Data: Live born female  Birth Weight: 6 lb 11.2 oz (3039  g) APGAR: 1, 6  Home with mother.  Marta Antu, [place] 04/10/2015, 12:08 PM

## 2016-11-15 ENCOUNTER — Ambulatory Visit (INDEPENDENT_AMBULATORY_CARE_PROVIDER_SITE_OTHER): Payer: Medicaid Other | Admitting: Obstetrics and Gynecology

## 2016-11-15 ENCOUNTER — Encounter: Payer: Self-pay | Admitting: Obstetrics and Gynecology

## 2016-11-15 VITALS — BP 118/70 | Ht 66.0 in | Wt 134.0 lb

## 2016-11-15 DIAGNOSIS — Z30432 Encounter for removal of intrauterine contraceptive device: Secondary | ICD-10-CM | POA: Diagnosis not present

## 2016-11-15 NOTE — Progress Notes (Signed)
    Previous discussion regarding whether to keep IUD or have it removed.  She presents today to have IUD removed.   IUD Removal  Patient identified, informed consent performed, consent signed.  Patient was in the dorsal lithotomy position, normal external genitalia was noted.  A speculum was placed in the patient's vagina, normal discharge was noted, no lesions. The cervix was visualized, no lesions, no abnormal discharge.  The strings of the IUD were grasped and pulled using ring forceps. The IUD was removed in its entirety. Patient tolerated the procedure well.    Patient will use nothing for contraception.  She was told to avoid teratogens, take PNV and folic acid.  Routine preventative health maintenance measures emphasized.  Thomasene Mohair, MD 11/15/2016 4:29 PM

## 2016-11-23 ENCOUNTER — Telehealth: Payer: Self-pay

## 2016-11-23 ENCOUNTER — Ambulatory Visit: Payer: Medicaid Other | Admitting: Obstetrics and Gynecology

## 2016-11-23 NOTE — Telephone Encounter (Signed)
Pt called wanting to know how soon would blood test detect preg.  Adv to wait until she misses at least one period.  Pt states she knows she is preg b/c she is feeling the same way she did when she was preg with her first child.  She has breast tenderness, face breaking out and weakness.  Even though IUD was just taken out 11/15/16 pt states she will wait a week before doing preg test.

## 2018-03-14 ENCOUNTER — Ambulatory Visit: Payer: Self-pay | Admitting: Adult Health

## 2018-05-15 ENCOUNTER — Ambulatory Visit (INDEPENDENT_AMBULATORY_CARE_PROVIDER_SITE_OTHER): Payer: Medicaid Other | Admitting: Advanced Practice Midwife

## 2018-05-15 ENCOUNTER — Encounter: Payer: Self-pay | Admitting: Advanced Practice Midwife

## 2018-05-15 ENCOUNTER — Other Ambulatory Visit (HOSPITAL_COMMUNITY)
Admission: RE | Admit: 2018-05-15 | Discharge: 2018-05-15 | Disposition: A | Discharge status: Home / Self Care | Payer: Medicaid Other | Source: Ambulatory Visit | Attending: Advanced Practice Midwife | Admitting: Advanced Practice Midwife

## 2018-05-15 VITALS — BP 108/66 | HR 88 | Wt 144.0 lb

## 2018-05-15 DIAGNOSIS — O0990 Supervision of high risk pregnancy, unspecified, unspecified trimester: Secondary | ICD-10-CM

## 2018-05-15 DIAGNOSIS — Z124 Encounter for screening for malignant neoplasm of cervix: Secondary | ICD-10-CM

## 2018-05-15 DIAGNOSIS — Z113 Encounter for screening for infections with a predominantly sexual mode of transmission: Secondary | ICD-10-CM

## 2018-05-15 DIAGNOSIS — Z8759 Personal history of other complications of pregnancy, childbirth and the puerperium: Secondary | ICD-10-CM | POA: Insufficient documentation

## 2018-05-15 DIAGNOSIS — Z3A01 Less than 8 weeks gestation of pregnancy: Secondary | ICD-10-CM

## 2018-05-15 DIAGNOSIS — O09291 Supervision of pregnancy with other poor reproductive or obstetric history, first trimester: Secondary | ICD-10-CM | POA: Diagnosis not present

## 2018-05-15 DIAGNOSIS — O34219 Maternal care for unspecified type scar from previous cesarean delivery: Secondary | ICD-10-CM | POA: Diagnosis not present

## 2018-05-15 NOTE — Patient Instructions (Addendum)
First Trimester of Pregnancy The first trimester of pregnancy is from week 1 until the end of week 13 (months 1 through 3). A week after a sperm fertilizes an egg, the egg will implant on the wall of the uterus. This embryo will begin to develop into a baby. Genes from you and your partner will form the baby. The female genes will determine whether the baby will be a boy or a girl. At 6-8 weeks, the eyes and face will be formed, and the heartbeat can be seen on ultrasound. At the end of 12 weeks, all the baby's organs will be formed. Now that you are pregnant, you will want to do everything you can to have a healthy baby. Two of the most important things are to get good prenatal care and to follow your health care provider's instructions. Prenatal care is all the medical care you receive before the baby's birth. This care will help prevent, find, and treat any problems during the pregnancy and childbirth. Body changes during your first trimester Your body goes through many changes during pregnancy. The changes vary from woman to woman.  You may gain or lose a couple of pounds at first.  You may feel sick to your stomach (nauseous) and you may throw up (vomit). If the vomiting is uncontrollable, call your health care provider.  You may tire easily.  You may develop headaches that can be relieved by medicines. All medicines should be approved by your health care provider.  You may urinate more often. Painful urination may mean you have a bladder infection.  You may develop heartburn as a result of your pregnancy.  You may develop constipation because certain hormones are causing the muscles that push stool through your intestines to slow down.  You may develop hemorrhoids or swollen veins (varicose veins).  Your breasts may begin to grow larger and become tender. Your nipples may stick out more, and the tissue that surrounds them (areola) may become darker.  Your gums may bleed and may be  sensitive to brushing and flossing.  Dark spots or blotches (chloasma, mask of pregnancy) may develop on your face. This will likely fade after the baby is born.  Your menstrual periods will stop.  You may have a loss of appetite.  You may develop cravings for certain kinds of food.  You may have changes in your emotions from day to day, such as being excited to be pregnant or being concerned that something may go wrong with the pregnancy and baby.  You may have more vivid and strange dreams.  You may have changes in your hair. These can include thickening of your hair, rapid growth, and changes in texture. Some women also have hair loss during or after pregnancy, or hair that feels dry or thin. Your hair will most likely return to normal after your baby is born.  What to expect at prenatal visits During a routine prenatal visit:  You will be weighed to make sure you and the baby are growing normally.  Your blood pressure will be taken.  Your abdomen will be measured to track your baby's growth.  The fetal heartbeat will be listened to between weeks 10 and 14 of your pregnancy.  Test results from any previous visits will be discussed.  Your health care provider may ask you:  How you are feeling.  If you are feeling the baby move.  If you have had any abnormal symptoms, such as leaking fluid, bleeding, severe headaches,   or abdominal cramping.  If you are using any tobacco products, including cigarettes, chewing tobacco, and electronic cigarettes.  If you have any questions.  Other tests that may be performed during your first trimester include:  Blood tests to find your blood type and to check for the presence of any previous infections. The tests will also be used to check for low iron levels (anemia) and protein on red blood cells (Rh antibodies). Depending on your risk factors, or if you previously had diabetes during pregnancy, you may have tests to check for high blood  sugar that affects pregnant women (gestational diabetes).  Urine tests to check for infections, diabetes, or protein in the urine.  An ultrasound to confirm the proper growth and development of the baby.  Fetal screens for spinal cord problems (spina bifida) and Down syndrome.  HIV (human immunodeficiency virus) testing. Routine prenatal testing includes screening for HIV, unless you choose not to have this test.  You may need other tests to make sure you and the baby are doing well.  Follow these instructions at home: Medicines  Follow your health care provider's instructions regarding medicine use. Specific medicines may be either safe or unsafe to take during pregnancy.  Take a prenatal vitamin that contains at least 600 micrograms (mcg) of folic acid.  If you develop constipation, try taking a stool softener if your health care provider approves. Eating and drinking  Eat a balanced diet that includes fresh fruits and vegetables, whole grains, good sources of protein such as meat, eggs, or tofu, and low-fat dairy. Your health care provider will help you determine the amount of weight gain that is right for you.  Avoid raw meat and uncooked cheese. These carry germs that can cause birth defects in the baby.  Eating four or five small meals rather than three large meals a day may help relieve nausea and vomiting. If you start to feel nauseous, eating a few soda crackers can be helpful. Drinking liquids between meals, instead of during meals, also seems to help ease nausea and vomiting.  Limit foods that are high in fat and processed sugars, such as fried and sweet foods.  To prevent constipation: ? Eat foods that are high in fiber, such as fresh fruits and vegetables, whole grains, and beans. ? Drink enough fluid to keep your urine clear or pale yellow. Activity  Exercise only as directed by your health care provider. Most women can continue their usual exercise routine during  pregnancy. Try to exercise for 30 minutes at least 5 days a week. Exercising will help you: ? Control your weight. ? Stay in shape. ? Be prepared for labor and delivery.  Experiencing pain or cramping in the lower abdomen or lower back is a good sign that you should stop exercising. Check with your health care provider before continuing with normal exercises.  Try to avoid standing for long periods of time. Move your legs often if you must stand in one place for a long time.  Avoid heavy lifting.  Wear low-heeled shoes and practice good posture.  You may continue to have sex unless your health care provider tells you not to. Relieving pain and discomfort  Wear a good support bra to relieve breast tenderness.  Take warm sitz baths to soothe any pain or discomfort caused by hemorrhoids. Use hemorrhoid cream if your health care provider approves.  Rest with your legs elevated if you have leg cramps or low back pain.  If you develop   varicose veins in your legs, wear support hose. Elevate your feet for 15 minutes, 3-4 times a day. Limit salt in your diet. Prenatal care  Schedule your prenatal visits by the twelfth week of pregnancy. They are usually scheduled monthly at first, then more often in the last 2 months before delivery.  Write down your questions. Take them to your prenatal visits.  Keep all your prenatal visits as told by your health care provider. This is important. Safety  Wear your seat belt at all times when driving.  Make a list of emergency phone numbers, including numbers for family, friends, the hospital, and police and fire departments. General instructions  Ask your health care provider for a referral to a local prenatal education class. Begin classes no later than the beginning of month 6 of your pregnancy.  Ask for help if you have counseling or nutritional needs during pregnancy. Your health care provider can offer advice or refer you to specialists for help  with various needs.  Do not use hot tubs, steam rooms, or saunas.  Do not douche or use tampons or scented sanitary pads.  Do not cross your legs for long periods of time.  Avoid cat litter boxes and soil used by cats. These carry germs that can cause birth defects in the baby and possibly loss of the fetus by miscarriage or stillbirth.  Avoid all smoking, herbs, alcohol, and medicines not prescribed by your health care provider. Chemicals in these products affect the formation and growth of the baby.  Do not use any products that contain nicotine or tobacco, such as cigarettes and e-cigarettes. If you need help quitting, ask your health care provider. You may receive counseling support and other resources to help you quit.  Schedule a dentist appointment. At home, brush your teeth with a soft toothbrush and be gentle when you floss. Contact a health care provider if:  You have dizziness.  You have mild pelvic cramps, pelvic pressure, or nagging pain in the abdominal area.  You have persistent nausea, vomiting, or diarrhea.  You have a bad smelling vaginal discharge.  You have pain when you urinate.  You notice increased swelling in your face, hands, legs, or ankles.  You are exposed to fifth disease or chickenpox.  You are exposed to German measles (rubella) and have never had it. Get help right away if:  You have a fever.  You are leaking fluid from your vagina.  You have spotting or bleeding from your vagina.  You have severe abdominal cramping or pain.  You have rapid weight gain or loss.  You vomit blood or material that looks like coffee grounds.  You develop a severe headache.  You have shortness of breath.  You have any kind of trauma, such as from a fall or a car accident. Summary  The first trimester of pregnancy is from week 1 until the end of week 13 (months 1 through 3).  Your body goes through many changes during pregnancy. The changes vary from  woman to woman.  You will have routine prenatal visits. During those visits, your health care provider will examine you, discuss any test results you may have, and talk with you about how you are feeling. This information is not intended to replace advice given to you by your health care provider. Make sure you discuss any questions you have with your health care provider. Document Released: 07/04/2001 Document Revised: 06/21/2016 Document Reviewed: 06/21/2016 Elsevier Interactive Patient Education  2018 [employer] Morning Sickness Morning sickness is when you feel sick to your stomach (nauseous) during pregnancy. This nauseous feeling may or may not come with vomiting. It often occurs in the morning but can be a problem any time of day. Morning sickness is most common during the first trimester, but it may continue throughout pregnancy. While morning sickness is unpleasant, it is usually harmless unless you develop severe and continual vomiting (hyperemesis gravidarum). This condition requires more intense treatment. What are the causes? The cause of morning sickness is not completely known but seems to be related to normal hormonal changes that occur in pregnancy. What increases the risk? You are at greater risk if you:  Experienced nausea or vomiting before your pregnancy.  Had morning sickness during a previous pregnancy.  Are pregnant with more than one baby, such as twins.  How is this treated? Do not use any medicines (prescription, over-the-counter, or herbal) for morning sickness without first talking to your health care provider. Your health care provider may prescribe or recommend:  Vitamin B6 supplements.  Anti-nausea medicines.  The herbal medicine ginger.  Follow these instructions at home:  Only take over-the-counter or prescription medicines as directed by your health care provider.  Taking multivitamins before getting pregnant can prevent or decrease the severity  of morning sickness in most women.  Eat a piece of dry toast or unsalted crackers before getting out of bed in the morning.  Eat five or six small meals a day.  Eat dry and bland foods (rice, baked potato). Foods high in carbohydrates are often helpful.  Do not drink liquids with your meals. Drink liquids between meals.  Avoid greasy, fatty, and spicy foods.  Get someone to cook for you if the smell of any food causes nausea and vomiting.  If you feel nauseous after taking prenatal vitamins, take the vitamins at night or with a snack.  Snack on protein foods (nuts, yogurt, cheese) between meals if you are hungry.  Eat unsweetened gelatins for desserts.  Wearing an acupressure wristband (worn for sea sickness) may be helpful.  Acupuncture may be helpful.  Do not smoke.  Get a humidifier to keep the air in your house free of odors.  Get plenty of fresh air. Contact a health care provider if:  Your home remedies are not working, and you need medicine.  You feel dizzy or lightheaded.  You are losing weight. Get help right away if:  You have persistent and uncontrolled nausea and vomiting.  You pass out (faint). This information is not intended to replace advice given to you by your health care provider. Make sure you discuss any questions you have with your health care provider. Document Released: 08/31/2006 Document Revised: 12/16/2015 Document Reviewed: 12/25/2012 Elsevier Interactive Patient Education  2017 [employer] Prenatal Care WHAT IS PRENATAL CARE? Prenatal care is the process of caring for a pregnant woman before she gives birth. Prenatal care makes sure that she and her baby remain as healthy as possible throughout pregnancy. Prenatal care may be provided by a midwife, family practice health care provider, or a childbirth and pregnancy specialist (obstetrician). Prenatal care may include physical examinations, testing, treatments, and education on nutrition,  lifestyle, and social support services. WHY IS PRENATAL CARE SO IMPORTANT? Early and consistent prenatal care increases the chance that you and your baby will remain healthy throughout your pregnancy. This type of care also decreases a baby's risk of being born too early (prematurely), or being born smaller than expected (  small for gestational age). Any underlying medical conditions you may have that could pose a risk during your pregnancy are discussed during prenatal care visits. You will also be monitored regularly for any new conditions that may arise during your pregnancy so they can be treated quickly and effectively. WHAT HAPPENS DURING PRENATAL CARE VISITS? Prenatal care visits may include the following: Discussion Tell your health care provider about any new signs or symptoms you have experienced since your last visit. These might include:  Nausea or vomiting.  Increased or decreased level of energy.  Difficulty sleeping.  Back or leg pain.  Weight changes.  Frequent urination.  Shortness of breath with physical activity.  Changes in your skin, such as the development of a rash or itchiness.  Vaginal discharge or bleeding.  Feelings of excitement or nervousness.  Changes in your baby's movements.  You may want to write down any questions or topics you want to discuss with your health care provider and bring them with you to your appointment. Examination During your first prenatal care visit, you will likely have a complete physical exam. Your health care provider will often examine your vagina, cervix, and the position of your uterus, as well as check your heart, lungs, and other body systems. As your pregnancy progresses, your health care provider will measure the size of your uterus and your baby's position inside your uterus. He or she may also examine you for early signs of labor. Your prenatal visits may also include checking your blood pressure and, after about 10-12  weeks of pregnancy, listening to your baby's heartbeat. Testing Regular testing often includes:  Urinalysis. This checks your urine for glucose, protein, or signs of infection.  Blood count. This checks the levels of white and red blood cells in your body.  Tests for sexually transmitted infections (STIs). Testing for STIs at the beginning of pregnancy is routinely done and is required in many states.  Antibody testing. You will be checked to see if you are immune to certain illnesses, such as rubella, that can affect a developing fetus.  Glucose screen. Around 24-28 weeks of pregnancy, your blood glucose level will be checked for signs of gestational diabetes. Follow-up tests may be recommended.  Group B strep. This is a bacteria that is commonly found inside a woman's vagina. This test will inform your health care provider if you need an antibiotic to reduce the amount of this bacteria in your body prior to labor and childbirth.  Ultrasound. Many pregnant women undergo an ultrasound screening around 18-20 weeks of pregnancy to evaluate the health of the fetus and check for any developmental abnormalities.  HIV (human immunodeficiency virus) testing. Early in your pregnancy, you will be screened for HIV. If you are at high risk for HIV, this test may be repeated during your third trimester of pregnancy.  You may be offered other testing based on your age, personal or family medical history, or other factors. HOW OFTEN SHOULD I PLAN TO SEE MY HEALTH CARE PROVIDER FOR PRENATAL CARE? Your prenatal care check-up schedule depends on any medical conditions you have before, or develop during, your pregnancy. If you do not have any underlying medical conditions, you will likely be seen for checkups:  Monthly, during the first 6 months of pregnancy.  Twice a month during months 7 and 8 of pregnancy.  Weekly starting in the 9th month of pregnancy and until delivery.  If you develop signs of  early labor or other concerning signs  or symptoms, you may need to see your health care provider more often. Ask your health care provider what prenatal care schedule is best for you. WHAT CAN I DO TO KEEP MYSELF AND MY BABY AS HEALTHY AS POSSIBLE DURING MY PREGNANCY?  Take a prenatal vitamin containing 400 micrograms (0.4 mg) of folic acid every day. Your health care provider may also ask you to take additional vitamins such as iodine, vitamin D, iron, copper, and zinc.  Take 1500-2000 mg of calcium daily starting at your 20th week of pregnancy until you deliver your baby.  Make sure you are up to date on your vaccinations. Unless directed otherwise by your health care provider: ? You should receive a tetanus, diphtheria, and pertussis (Tdap) vaccination between the 27th and 36th week of your pregnancy, regardless of when your last Tdap immunization occurred. This helps protect your baby from whooping cough (pertussis) after he or she is born. ? You should receive an annual inactivated influenza vaccine (IIV) to help protect you and your baby from influenza. This can be done at any point during your pregnancy.  Eat a well-rounded diet that includes: ? Fresh fruits and vegetables. ? Lean proteins. ? Calcium-rich foods such as milk, yogurt, hard cheeses, and dark, leafy greens. ? Whole grain breads.  Do noteat seafood high in mercury, including: ? Swordfish. ? Tilefish. ? Shark. ? King mackerel. ? More than 6 oz tuna per week.  Do not eat: ? Raw or undercooked meats or eggs. ? Unpasteurized foods, such as soft cheeses (brie, blue, or feta), juices, and milks. ? Lunch meats. ? Hot dogs that have not been heated until they are steaming.  Drink enough water to keep your urine clear or pale yellow. For many women, this may be 10 or more 8 oz glasses of water each day. Keeping yourself hydrated helps deliver nutrients to your baby and may prevent the start of pre-term uterine  contractions.  Do not use any tobacco products including cigarettes, chewing tobacco, or electronic cigarettes. If you need help quitting, ask your health care provider.  Do not drink beverages containing alcohol. No safe level of alcohol consumption during pregnancy has been determined.  Do not use any illegal drugs. These can harm your developing baby or cause a miscarriage.  Ask your health care provider or pharmacist before taking any prescription or over-the-counter medicines, herbs, or supplements.  Limit your caffeine intake to no more than 200 mg per day.  Exercise. Unless told otherwise by your health care provider, try to get 30 minutes of moderate exercise most days of the week. Do not  do high-impact activities, contact sports, or activities with a high risk of falling, such as horseback riding or downhill skiing.  Get plenty of rest.  Avoid anything that raises your body temperature, such as hot tubs and saunas.  If you own a cat, do not empty its litter box. Bacteria contained in cat feces can cause an infection called toxoplasmosis. This can result in serious harm to the fetus.  Stay away from chemicals such as insecticides, lead, mercury, and cleaning or paint products that contain solvents.  Do not have any X-rays taken unless medically necessary.  Take a childbirth and breastfeeding preparation class. Ask your health care provider if you need a referral or recommendation.  This information is not intended to replace advice given to you by your health care provider. Make sure you discuss any questions you have with your health care provider. Document Released:  07/13/2003 Document Revised: 12/13/2015 Document Reviewed: 09/24/2013 Elsevier Interactive Patient Education  2017 [employer]. Eating Plan for Pregnant Women While you are pregnant, your body will require additional nutrition to help support your growing baby. It is recommended that you consume:  150  additional calories each day during your first trimester.  300 additional calories each day during your second trimester.  300 additional calories each day during your third trimester.  Eating a healthy, well-balanced diet is very important for your health and for your baby's health. You also have a higher need for some vitamins and minerals, such as folic acid, calcium, iron, and vitamin D. What do I need to know about eating during pregnancy?  Do not try to lose weight or go on a diet during pregnancy.  Choose healthy, nutritious foods. Choose  of a sandwich with a glass of milk instead of a candy bar or a high-calorie sugar-sweetened beverage.  Limit your overall intake of foods that have "empty calories." These are foods that have little nutritional value, such as sweets, desserts, candies, sugar-sweetened beverages, and fried foods.  Eat a variety of foods, especially fruits and vegetables.  Take a prenatal vitamin to help meet the additional needs during pregnancy, specifically for folic acid, iron, calcium, and vitamin D.  Remember to stay active. Ask your health care provider for exercise recommendations that are specific to you.  Practice good food safety and cleanliness, such as washing your hands before you eat and after you prepare raw meat. This helps to prevent foodborne illnesses, such as listeriosis, that can be very dangerous for your baby. Ask your health care provider for more information about listeriosis. What does 150 extra calories look like? Healthy options for an additional 150 calories each day could be any of the following:  Plain low-fat yogurt (6-8 oz) with  cup of berries.  1 apple with 2 teaspoons of peanut butter.  Cut-up vegetables with  cup of hummus.  Low-fat chocolate milk (8 oz or 1 cup).  1 string cheese with 1 medium orange.   of a peanut butter and jelly sandwich on whole-wheat bread (1 tsp of peanut butter).  For 300 calories, you  could eat two of those healthy options each day. What is a healthy amount of weight to gain? The recommended amount of weight for you to gain is based on your pre-pregnancy BMI. If your pre-pregnancy BMI was:  Less than 18 (underweight), you should gain 28-40 lb.  18-24.9 (normal), you should gain 25-35 lb.  25-29.9 (overweight), you should gain 15-25 lb.  Greater than 30 (obese), you should gain 11-20 lb.  What if I am having twins or multiples? Generally, pregnant women who will be having twins or multiples may need to increase their daily calories by 300-600 calories each day. The recommended range for total weight gain is 25-54 lb, depending on your pre-pregnancy BMI. Talk with your health care provider for specific guidance about additional nutritional needs, weight gain, and exercise during your pregnancy. What foods can I eat? Grains Any grains. Try to choose whole grains, such as whole-wheat bread, oatmeal, or brown rice. Vegetables Any vegetables. Try to eat a variety of colors and types of vegetables to get a full range of vitamins and minerals. Remember to wash your vegetables well before eating. Fruits Any fruits. Try to eat a variety of colors and types of fruit to get a full range of vitamins and minerals. Remember to wash your fruits well before eating. Meats  and Other Protein Sources Lean meats, including chicken, [place], fish, and lean cuts of beef, veal, or pork. Make sure that all meats are cooked to "well done." Tofu. Tempeh. Beans. Eggs. Peanut butter and other nut butters. Seafood, such as shrimp, crab, and lobster. If you choose fish, select types that are higher in omega-3 fatty acids, including salmon, herring, mussels, trout, sardines, and pollock. Make sure that all meats are cooked to food-safe temperatures. Dairy Pasteurized milk and milk alternatives. Pasteurized yogurt and pasteurized cheese. Cottage cheese. Sour cream. Beverages Water. Juices that contain  100% fruit juice or vegetable juice. Caffeine-free teas and decaffeinated coffee. Drinks that contain caffeine are okay to drink, but it is better to avoid caffeine. Keep your total caffeine intake to less than 200 mg each day (12 oz of coffee, tea, or soda) or as directed by your health care provider. Condiments Any pasteurized condiments. Sweets and Desserts Any sweets and desserts. Fats and Oils Any fats and oils. The items listed above may not be a complete list of recommended foods or beverages. Contact your dietitian for more options. What foods are not recommended? Vegetables Unpasteurized (raw) vegetable juices. Fruits Unpasteurized (raw) fruit juices. Meats and Other Protein Sources Cured meats that have nitrates, such as bacon, salami, and hotdogs. Luncheon meats, bologna, or other deli meats (unless they are reheated until they are steaming hot). Refrigerated pate, meat spreads from a meat counter, smoked seafood that is found in the refrigerated section of a store. Raw fish, such as sushi or sashimi. High mercury content fish, such as tilefish, shark, swordfish, and king mackerel. Raw meats, such as tuna or beef tartare. Undercooked meats and poultry. Make sure that all meats are cooked to food-safe temperatures. Dairy Unpasteurized (raw) milk and any foods that have raw milk in them. Soft cheeses, such as feta, queso blanco, queso fresco, Brie, Camembert cheeses, blue-veined cheeses, and Panela cheese (unless it is made with pasteurized milk, which must be stated on the label). Beverages Alcohol. Sugar-sweetened beverages, such as sodas, teas, or energy drinks. Condiments Homemade fermented foods and drinks, such as pickles, sauerkraut, or kombucha drinks. (Store-bought pasteurized versions of these are okay.) Other Salads that are made in the store, such as ham salad, chicken salad, egg salad, tuna salad, and seafood salad. The items listed above may not be a complete list of  foods and beverages to avoid. Contact your dietitian for more information. This information is not intended to replace advice given to you by your health care provider. Make sure you discuss any questions you have with your health care provider. Document Released: 04/24/2014 Document Revised: 12/16/2015 Document Reviewed: 12/23/2013 Elsevier Interactive Patient Education  2018 [employer]. Exercise During Pregnancy For people of all ages, exercise is an important part of being healthy. Exercise improves heart and lung function and helps to maintain strength, flexibility, and a healthy body weight. Exercise also boosts energy levels and elevates mood. For most women, maintaining an exercise routine throughout pregnancy is recommended. It is only on rare occasions and with certain medical conditions or pregnancy complications that women may be asked to limit or avoid exercise during pregnancy. What are some other benefits to exercising during pregnancy? Along with maintaining strength and flexibility, exercising throughout pregnancy can help to:  Keep strength in muscles that are very important during labor and childbirth.  Decrease low back pain during pregnancy.  Decrease the risk of developing gestational diabetes mellitus (GDM).  Improve blood sugar (glucose) control for women who  have GDM.  Decrease the risk of developing preeclampsia. This is a serious condition that causes high blood pressure along with other symptoms, such as swelling and headaches.  Decrease the risk of cesarean delivery.  Speed up the recovery after giving birth.  How often should I exercise? Unless your health care provider gives you different instructions, you should try to exercise on most days or all days of the week. In general, try to exercise with moderate intensity for about 150 minutes per week. This can be spread out across several days, such as exercising for 30 minutes per day on 5 days of each week.  You can tell that you are exercising at a moderate intensity if you have a higher heart rate and faster breathing, but you are still able to hold a conversation. What types of moderate-intensity exercise are recommended during pregnancy? There are many types of exercise that are safe for you to do during pregnancy. Unless your health care provider gives you different instructions, do a variety of exercises that safely increase your heart and breathing (cardiopulmonary) rates and help you to build and maintain muscle strength (strength training). You should always be able to talk in full sentences while exercising during pregnancy. Some examples of exercising that is safe to do during pregnancy include:  Brisk walking or hiking.  Swimming.  Water aerobics.  Riding a stationary bike.  Strength training.  Modified yoga or Pilates. Tell your instructor that you are pregnant. Avoid overstretching and avoid lying on your back for long periods of time.  Running or jogging. Only choose this type of exercise if: ? You ran or jogged regularly before your pregnancy. ? You can run or jog and still talk in complete sentences.  What types of exercise should I not do during pregnancy? Depending on your level of fitness and whether you exercised regularly before your pregnancy, you may be advised to limit vigorous-intensity exercise during your pregnancy. You can tell that you are exercising at a vigorous intensity if you are breathing much harder and faster and cannot hold a conversation while exercising. Some examples of exercising that you should avoid during pregnancy include:  Contact sports.  Activities that place you at risk for falling on or being hit in the belly, such as downhill skiing, water skiing, surfing, rock climbing, cycling, gymnastics, and horseback riding.  Scuba diving.  Sky diving.  Yoga or Pilates in a room that is heated to extreme temperatures ("hot yoga" or "hot  Pilates").  Jogging or running, unless you ran or jogged regularly before your pregnancy. While jogging or running, you should always be able to talk in full sentences. Do not run or jog so vigorously that you are unable to have a conversation.  If you are not used to exercising at elevation (more than 6,000 feet above sea level), do not do so during your pregnancy.  When should I avoid exercising during pregnancy? Certain medical conditions can make it unsafe to exercise during pregnancy, or they may increase your risk of miscarriage or early labor and birth. Some of these conditions include:  Some types of heart disease.  Some types of lung disease.  Placenta previa. This is when the placenta partially or completely covers the opening of the uterus (cervix).  Frequent bleeding from the vagina during your pregnancy.  Incompetent cervix. This is when your cervix does not remain as tightly closed during pregnancy as it should.  Premature labor.  Ruptured membranes. This is when the  protective sac (amniotic sac) opens up and amniotic fluid leaks from your vagina.  Severely low blood count (anemia).  Preeclampsia or pregnancy-caused high blood pressure.  Carrying more than one baby (multiple gestation) and having an additional risk of early labor.  Poorly controlled diabetes.  Being severely underweight or severely overweight.  Intrauterine growth restriction. This is when your baby's growth and development during pregnancy are slower than expected.  Other medical conditions. Ask your health care provider if any apply to you.  What else should I know about exercising during pregnancy? You should take these precautions while exercising during pregnancy:  Avoid overheating. ? Wear loose-fitting, breathable clothes. ? Do not exercise in very high temperatures.  Avoid dehydration. Drink enough water before, during, and after exercise to keep your urine clear or pale  yellow.  Avoid overstretching. Because of hormone changes during pregnancy, it is easy to overstretch muscles, tendons, and ligaments during pregnancy.  Start slowly and ask your health care provider to recommend types of exercise that are safe for you, if exercising regularly is new for you.  Pregnancy is not a time for exercising to lose weight. When should I seek medical care? You should stop exercising and call your health care provider if you have any unusual symptoms, such as:  Mild uterine contractions or abdominal cramping.  Dizziness that does not improve with rest.  When should I seek immediate medical care? You should stop exercising and call your local emergency services (911 in the [place] if you have any unusual symptoms, such as:  Sudden, severe pain in your low back or your belly.  Uterine contractions or abdominal cramping that do not improve with rest.  Chest pain.  Bleeding or fluid leaking from your vagina.  Shortness of breath.  This information is not intended to replace advice given to you by your health care provider. Make sure you discuss any questions you have with your health care provider. Document Released: 07/10/2005 Document Revised: 12/08/2015 Document Reviewed: 09/17/2014 Elsevier Interactive Patient Education  [employer].

## 2018-05-15 NOTE — Progress Notes (Signed)
New Obstetric Patient H&P    Chief Complaint: "Desires prenatal care"   History of Present Illness: Patient is a 27 y.o. G2P1001 Not Hispanic or Latino female, presents with amenorrhea and positive home pregnancy test. Patient's last menstrual period was 04/02/2018 (exact date). and based on her  LMP, her EDD is Estimated Date of Delivery: 01/07/19 and her EGA is [redacted]w[redacted]d. Cycles are 4. days, regular, and occur approximately every : 28 days. Her last pap smear was 3 years ago and was no abnormalities.    She had a urine pregnancy test which was positive 2 week(s)  ago. Her last menstrual period was normal and lasted for  4 day(s). Since her LMP she claims she has experienced breast tenderness, fatigue, nausea, vomiting. She denies vaginal bleeding. Her past medical history is contributory for scoliosis. Her prior pregnancies are notable for primary c/section for second stage arrest/problems with epidural due to scoliosis.  Since her LMP, she admits to the use of tobacco products  no She claims she has gained   6 pounds since the start of her pregnancy.  There are cats in the home in the home  no  She admits close contact with children on a regular basis  yes  She has had chicken pox in the past no She has had Tuberculosis exposures, symptoms, or previously tested positive for TB   no Current or past history of domestic violence. no  Genetic Screening/Teratology Counseling: (Includes patient, baby's father, or anyone in either family with:)   1. Patient's age >/= 12 at [facility]  no 2. Thalassemia ([place], [place], Mediterranean, or Asian background): MCV<80  no 3. Neural tube defect (meningomyelocele, spina bifida, anencephaly)  no 4. Congenital heart defect  no  5. Down syndrome  no 6. Tay-Sachs (Jewish, [place]  no 7. Canavan's Disease  no 8. Sickle cell disease or trait (African)  no  9. Hemophilia or other blood disorders  no  10. Muscular dystrophy  no  11. Cystic fibrosis  no    12. Huntington's Chorea  no  13. Mental retardation/autism  no 14. Other inherited genetic or chromosomal disorder  no 15. Maternal metabolic disorder (DM, PKU, etc)  no 16. Patient or FOB with a child with a birth defect not listed above no  16a. Patient or FOB with a birth defect themselves no 17. Recurrent pregnancy loss, or stillbirth  no  18. Any medications since LMP other than prenatal vitamins (include vitamins, supplements, OTC meds, drugs, alcohol)  no 19. Any other genetic/environmental exposure to discuss  no  Infection History:   1. Lives with someone with TB or TB exposed  no  2. Patient or partner has history of genital herpes  no 3. Rash or viral illness since LMP  no 4. History of STI (GC, CT, HPV, syphilis, HIV)  no 5. History of recent travel :  no  Other pertinent information:  no     Review of Systems:10 point review of systems negative unless otherwise noted in HPI  Past Medical History:  History reviewed. No pertinent past medical history.  Past Surgical History:  Past Surgical History:  Procedure Laterality Date  . CESAREAN SECTION N/A 04/08/2015   Procedure: CESAREAN SECTION;  Surgeon: Vena Austria, MD;  Location: [facility] ORS;  Service: Obstetrics;  Laterality: N/A;    Gynecologic History: Patient's last menstrual period was 04/02/2018 (exact date).  Obstetric History: G2P1001  Family History:  History reviewed. No pertinent family history.  Social History:  Social History   Socioeconomic History  . Marital status: Single    Spouse name: Not on file  . Number of children: Not on file  . Years of education: Not on file  . Highest education level: Not on file  Occupational History  . Not on file  Social Needs  . Financial resource strain: Not on file  . Food insecurity:    Worry: Not on file    Inability: Not on file  . Transportation needs:    Medical: Not on file    Non-medical: Not on file  Tobacco Use  . Smoking status: Never  Smoker  . Smokeless tobacco: Never Used  Substance and Sexual Activity  . Alcohol use: No  . Drug use: No  . Sexual activity: Yes    Birth control/protection: None  Lifestyle  . Physical activity:    Days per week: Not on file    Minutes per session: Not on file  . Stress: Not on file  Relationships  . Social connections:    Talks on phone: Not on file    Gets together: Not on file    Attends religious service: Not on file    Active member of club or organization: Not on file    Attends meetings of clubs or organizations: Not on file    Relationship status: Not on file  . Intimate partner violence:    Fear of current or ex partner: Not on file    Emotionally abused: Not on file    Physically abused: Not on file    Forced sexual activity: Not on file  Other Topics Concern  . Not on file  Social History Narrative  . Not on file    Allergies:  No Known Allergies  Medications: Prior to Admission medications   Medication Sig Start Date End Date Taking? Authorizing Provider  Prenatal Vit-Fe Fumarate-FA (MULTIVITAMIN-PRENATAL) 27-0.8 MG TABS tablet Take 1 tablet by mouth daily at 12 noon.    [provider]    Physical Exam Vitals: Blood pressure 108/66, pulse 88, weight 144 lb (65.3 kg), last menstrual period 04/02/2018, unknown if currently breastfeeding.  General: NAD HEENT: normocephalic, anicteric Thyroid: no enlargement, no palpable nodules Pulmonary: No increased work of breathing, CTAB Cardiovascular: RRR, distal pulses 2+ Abdomen: NABS, soft, non-tender, non-distended.  Umbilicus without lesions.  No hepatomegaly, splenomegaly or masses palpable. No evidence of hernia  Genitourinary:  External: Normal external female genitalia.  Normal urethral meatus, normal  Bartholin's and Skene's glands.    Vagina: Normal vaginal mucosa, no evidence of prolapse.    Cervix: Grossly normal in appearance, no bleeding, no CMT  Uterus: Enlarged, mobile, normal contour.      Adnexa: ovaries non-enlarged, no adnexal masses  Rectal: deferred Extremities: no edema, erythema, or tenderness Neurologic: Grossly intact Psychiatric: mood appropriate, affect full   Assessment: 27 y.o. G2P1001 at [redacted]w[redacted]d by LMP presenting to initiate prenatal care  Plan: 1) Avoid alcoholic beverages. 2) Patient encouraged not to smoke.  3) Discontinue the use of all non-medicinal drugs and chemicals.  4) Take prenatal vitamins daily.  5) Nutrition, food safety (fish, cheese advisories, and high nitrite foods) and exercise discussed. 6) Hospital and practice style discussed with cross coverage system.  7) Genetic Screening, such as with 1st Trimester Screening, cell free fetal DNA, AFP testing, and Ultrasound, as well as with amniocentesis and CVS as appropriate, is discussed with patient. At the conclusion of today's visit patient requested cell free DNA genetic testing 8)  Patient is asked about travel to areas at risk for the Zika virus, and counseled to avoid travel and exposure to mosquitoes or sexual partners who may have themselves been exposed to the virus. Testing is discussed, and will be ordered as appropriate.  9) NOB labs today 10) Return in 1 week for dating and rob   Tresea Mall, [place] [facility], [facility] 05/15/2018, 11:55 AM

## 2018-05-15 NOTE — Progress Notes (Signed)
NOB Confirmed at [facility]

## 2018-05-16 LAB — RPR+RH+ABO+RUB AB+AB SCR+CB...
Antibody Screen: NEGATIVE
HEMATOCRIT: 43.6 % (ref 34.0–46.6)
HEMOGLOBIN: 14.5 g/dL (ref 11.1–15.9)
HEP B S AG: NEGATIVE
HIV SCREEN 4TH GENERATION: NONREACTIVE
MCH: 29.6 pg (ref 26.6–33.0)
MCHC: 33.3 g/dL (ref 31.5–35.7)
MCV: 89 fL (ref 79–97)
Platelets: 186 10*3/uL (ref 150–450)
RBC: 4.9 x10E6/uL (ref 3.77–5.28)
RDW: 11.8 % — ABNORMAL LOW (ref 12.3–15.4)
RH TYPE: POSITIVE
RPR: NONREACTIVE
RUBELLA: 8.59 {index} (ref >0.99)
Varicella zoster IgG: 819 index (ref >165)
WBC: 9.3 10*3/uL (ref 3.4–10.8)

## 2018-05-17 LAB — URINE CULTURE

## 2018-05-17 LAB — CYTOLOGY - PAP
CHLAMYDIA, DNA PROBE: NEGATIVE
Diagnosis: NEGATIVE
HPV: NOT DETECTED
NEISSERIA GONORRHEA: NEGATIVE
TRICH (WINDOWPATH): NEGATIVE

## 2018-05-17 LAB — URINE DRUG PANEL 7
AMPHETAMINES, URINE: NEGATIVE ng/mL
BARBITURATE QUANT UR: NEGATIVE ng/mL
Benzodiazepine Quant, Ur: NEGATIVE ng/mL
Cannabinoid Quant, Ur: NEGATIVE ng/mL
Cocaine (Metab.): NEGATIVE ng/mL
Opiate Quant, Ur: NEGATIVE ng/mL
PCP Quant, Ur: NEGATIVE ng/mL

## 2018-05-22 ENCOUNTER — Ambulatory Visit (INDEPENDENT_AMBULATORY_CARE_PROVIDER_SITE_OTHER): Payer: Medicaid Other | Admitting: Obstetrics and Gynecology

## 2018-05-22 ENCOUNTER — Ambulatory Visit (INDEPENDENT_AMBULATORY_CARE_PROVIDER_SITE_OTHER): Payer: Medicaid Other

## 2018-05-22 ENCOUNTER — Other Ambulatory Visit: Payer: Self-pay | Admitting: Advanced Practice Midwife

## 2018-05-22 ENCOUNTER — Encounter: Payer: Self-pay | Admitting: Obstetrics and Gynecology

## 2018-05-22 VITALS — BP 102/58 | Wt 141.0 lb

## 2018-05-22 DIAGNOSIS — Z3A01 Less than 8 weeks gestation of pregnancy: Secondary | ICD-10-CM | POA: Diagnosis not present

## 2018-05-22 DIAGNOSIS — O3680X0 Pregnancy with inconclusive fetal viability, not applicable or unspecified: Secondary | ICD-10-CM

## 2018-05-22 DIAGNOSIS — O09291 Supervision of pregnancy with other poor reproductive or obstetric history, first trimester: Secondary | ICD-10-CM | POA: Diagnosis not present

## 2018-05-22 DIAGNOSIS — O0990 Supervision of high risk pregnancy, unspecified, unspecified trimester: Secondary | ICD-10-CM

## 2018-05-22 DIAGNOSIS — Z8759 Personal history of other complications of pregnancy, childbirth and the puerperium: Secondary | ICD-10-CM

## 2018-05-22 DIAGNOSIS — IMO0002 Reserved for concepts with insufficient information to code with codable children: Secondary | ICD-10-CM

## 2018-05-22 DIAGNOSIS — Z3401 Encounter for supervision of normal first pregnancy, first trimester: Secondary | ICD-10-CM

## 2018-05-22 NOTE — Progress Notes (Signed)
ROB/Dating C/o Some nausea and vomiting

## 2018-05-22 NOTE — Progress Notes (Signed)
    Routine Prenatal Care Visit  Subjective  Tara Romero is a 27 y.o. G2P1001 at [redacted]w[redacted]d being seen today for ongoing prenatal care.  She is currently monitored for the following issues for this high-risk pregnancy and has Supervision of high risk pregnancy, antepartum and History of gestational hypertension on their problem list.  ----------------------------------------------------------------------------------- Patient reports no complaints.   Contractions: Not present. Vag. Bleeding: None.   . Denies leaking of fluid.  ----------------------------------------------------------------------------------- The following portions of the patient's history were reviewed and updated as appropriate: allergies, current medications, past family history, past medical history, past social history, past surgical history and problem list. Problem list updated.   Objective  Blood pressure (!) 102/58, weight 141 lb (64 kg), last menstrual period 04/02/2018, unknown if currently breastfeeding. Pregravid weight 138 lb (62.6 kg) Total Weight Gain 3 lb (1.361 kg) Urinalysis:      Fetal Status: Fetal Heart Rate (bpm): not present         General:  Alert, oriented and cooperative. Patient is in no acute distress.  Skin: Skin is warm and dry. No rash noted.   Cardiovascular: Normal heart rate noted  Respiratory: Normal respiratory effort, no problems with respiration noted  Abdomen: Soft, gravid, appropriate for gestational age. Pain/Pressure: Absent     Pelvic:  Cervical exam deferred        Extremities: Normal range of motion.     Mental Status: Normal mood and affect. Normal behavior. Normal judgment and thought content.     Assessment   27 y.o. G2P1001 at [redacted]w[redacted]d by  01/07/2019, by Last Menstrual Period presenting for routine prenatal visit  Plan   Pregnancy #2 Problems (from 04/02/18 to present)    Problem Noted Resolved   Supervision of high risk pregnancy, antepartum 05/15/2018 by Tresea Mall, CNM No   Overview Addendum 05/22/2018 12:57 PM by Natale Milch, MD    [facility] Prenatal Labs  Dating  Blood type: AB/Positive/-- (10/23 1017)   Genetic Screen 1 Screen:    AFP:     Quad:     NIPS: Antibody:Negative (10/23 1017)  Anatomic [place]  Rubella: 8.59 (10/23 1017) Varicella: Immune  GTT Early:               Third trimester:  RPR: Non Reactive (10/23 1017)   Rhogam  not needed HBsAg: Negative (10/23 1017)   TDaP vaccine                       Flu Shot: HIV: Non Reactive (10/23 1017)   Baby Food   Breast                             GBS:   Contraception  Pap:2019 NIL  CBB     CS/VBAC Primary 2016/desires repeat   Support Person Husband Vincenza Hews              Gestational age appropriate obstetric precautions including but not limited to vaginal bleeding, contractions, leaking of fluid and fetal movement were reviewed in detail with the patient.    No fetal heart beat seen today on ultrasound. DIiscussed that this is suspicious for pregnancy failure, follow up in 1 week for ultrasound.     Return in about 1 week (around 05/29/2018) for ROB and [place].  Natale Milch MD [facility], [facility] 05/22/2018, 12:56 PM

## 2018-05-29 ENCOUNTER — Ambulatory Visit (INDEPENDENT_AMBULATORY_CARE_PROVIDER_SITE_OTHER): Payer: Medicaid Other | Admitting: Obstetrics & Gynecology

## 2018-05-29 ENCOUNTER — Ambulatory Visit (INDEPENDENT_AMBULATORY_CARE_PROVIDER_SITE_OTHER): Payer: Medicaid Other

## 2018-05-29 VITALS — BP 100/60 | Wt 142.0 lb

## 2018-05-29 DIAGNOSIS — Z3A01 Less than 8 weeks gestation of pregnancy: Secondary | ICD-10-CM

## 2018-05-29 DIAGNOSIS — O3411 Maternal care for benign tumor of corpus uteri, first trimester: Secondary | ICD-10-CM

## 2018-05-29 DIAGNOSIS — N8311 Corpus luteum cyst of right ovary: Secondary | ICD-10-CM | POA: Diagnosis not present

## 2018-05-29 DIAGNOSIS — O021 Missed abortion: Secondary | ICD-10-CM

## 2018-05-29 DIAGNOSIS — Z3401 Encounter for supervision of normal first pregnancy, first trimester: Secondary | ICD-10-CM

## 2018-05-29 HISTORY — DX: Missed abortion: O02.1

## 2018-05-29 NOTE — Progress Notes (Signed)
HPI: Pt has no pain or bleeding.  Last week she had [place] that was off for dates and no FHT.  Ultrasound demonstrates no growth in CRL and no FHT.  PMHx: She  has no past medical history on file. Also,  has a past surgical history that includes Cesarean section (N/A, 04/08/2015)., family history is not on file.,  reports that she has never smoked. She has never used smokeless tobacco. She reports that she does not drink alcohol or use drugs.  She has a current medication list which includes the following prescription(s): multivitamin-prenatal. Also, has No Known Allergies.  Review of Systems  Constitutional: Negative for chills, fever and malaise/fatigue.  HENT: Negative for congestion, sinus pain and sore throat.   Eyes: Negative for blurred vision and pain.  Respiratory: Negative for cough and wheezing.   Cardiovascular: Negative for chest pain and leg swelling.  Gastrointestinal: Negative for abdominal pain, constipation, diarrhea, heartburn, nausea and vomiting.  Genitourinary: Negative for dysuria, frequency, hematuria and urgency.  Musculoskeletal: Negative for back pain, joint pain, myalgias and neck pain.  Skin: Negative for itching and rash.  Neurological: Negative for dizziness, tremors and weakness.  Endo/Heme/Allergies: Does not bruise/bleed easily.  Psychiatric/Behavioral: Negative for depression. The patient is not nervous/anxious and does not have insomnia.     Objective: BP 100/60   Wt 142 lb (64.4 kg)   LMP 04/02/2018 (Exact Date)   BMI 22.92 kg/m   Physical examination Constitutional NAD, Conversant  Skin No rashes, lesions or ulceration.   Extremities: Moves all appropriately.  Normal ROM for age. No lymphadenopathy.  Neuro: Grossly intact  Psych: Oriented to PPT.  Normal mood. Normal affect.   Assessment:  Missed ab Options discussed, exp mgt vs D&C         Condolences were offered to the patient and her family.  I stressed that while emotionally difficult,  that this did not occur because of an actions or inactions by the patient.  Somewhere between 10-20% of identified first trimester pregnancies will unfortunately end in miscarriage.  Given this relatively high incidence rate, further diagnostic testing such as chromosome analysis is generally not clinically relevant nor recommended.  Although the chromosomal abnormalities have been implicated at rates as high as 70% in some studies, these are generally random and do not infer and increased risk of recurrence with subsequent pregnancies.  However, 3 or more consecutive first trimester losses are relatively uncommon, and these patient generally do benefit from additional work up to determine a potential modifiable etiology.              We briefly discussed management options including expectant management, medical management, and surgical management as well as their relative success rates and complications. Approximately 80% of first trimester miscarriages will pass successfully but may require a time frame of up to 8 weeks (ACOG Practice Bulletin 150 May 2015 "Early Pregnancy Loss").  Medical management has literature supporting its use up to 63 days or [redacted]w[redacted]d gestation and results in a passage rate of 84-85% Environmental education officer Bulletin 143 March 2014 "Medical Management of First-Trimester Abortion").  Dilation and curettage has the highest rate of uterine evacuation, but carries with is operative cost, surgical and anesthetic risk.  While these risk are relatively small they nevertheless include infection, bleeding, uterine perforation, formation of uterine synechia, and in rare cases death.              We discussed repeat ultrasound and or trending HCG levels if the patient  wishes to pursue these prior to making her decision.  Clinically I am confident of the diagnosis, but I do not want any doubts in the patient's mind regarding the plan of management she chooses to adopt. The patient elects to proceed with surgical  management for her missed abortion. I have discussed with the patient the indications for the procedure. Included in the discussion were the options of therapy, as wall as their individual risks, benefits, and complications. Ample time was given to answer all questions.   While the incidence is low, the risks from this surgery include, but are not limited to, the risks of anesthesia, hemorrhage, infection, perforation, and injury to adjacent structures including bowel, bladder and blood vessels.   Reassess in 2 weeks AB POS  A total of 15 minutes were spent face-to-face with the patient during this encounter and over half of that time dealt with counseling and coordination of care.   Annamarie Major, MD, [facility], [facility] 05/29/2018  9:55 AM

## 2018-06-12 ENCOUNTER — Encounter: Payer: Self-pay | Admitting: Maternal Newborn

## 2018-06-12 ENCOUNTER — Ambulatory Visit (INDEPENDENT_AMBULATORY_CARE_PROVIDER_SITE_OTHER): Payer: Medicaid Other | Admitting: Maternal Newborn

## 2018-06-12 VITALS — BP 108/60 | Wt 141.0 lb

## 2018-06-12 DIAGNOSIS — O021 Missed abortion: Secondary | ICD-10-CM | POA: Diagnosis not present

## 2018-06-12 NOTE — Progress Notes (Signed)
Obstetrics & Gynecology Office Visit   History of Present Illness: Returning for a follow up visit for missed abortion diagnosed on 05/29/2018. Has not had symptoms of bleeding, occasional mild cramps. Nausea and pregnancy symptoms have stopped.  Review of Systems: Review of systems negative unless otherwise noted in HPI.  Past Medical History:  No past medical history on file.  Past Surgical History:  Past Surgical History:  Procedure Laterality Date  . CESAREAN SECTION N/A 04/08/2015   Procedure: CESAREAN SECTION;  Surgeon: Vena Austria, MD;  Location: [facility] ORS;  Service: Obstetrics;  Laterality: N/A;    Gynecologic History: Patient's last menstrual period was 04/02/2018 (exact date).  Obstetric History: G2P1001  Family History:  No family history on file.  Social History:  Social History   Socioeconomic History  . Marital status: Single    Spouse name: Not on file  . Number of children: Not on file  . Years of education: Not on file  . Highest education level: Not on file  Occupational History  . Not on file  Social Needs  . Financial resource strain: Not on file  . Food insecurity:    Worry: Not on file    Inability: Not on file  . Transportation needs:    Medical: Not on file    Non-medical: Not on file  Tobacco Use  . Smoking status: Never Smoker  . Smokeless tobacco: Never Used  Substance and Sexual Activity  . Alcohol use: No  . Drug use: No  . Sexual activity: Yes    Birth control/protection: None  Lifestyle  . Physical activity:    Days per week: Not on file    Minutes per session: Not on file  . Stress: Not on file  Relationships  . Social connections:    Talks on phone: Not on file    Gets together: Not on file    Attends religious service: Not on file    Active member of club or organization: Not on file    Attends meetings of clubs or organizations: Not on file    Relationship status: Not on file  . Intimate partner violence:   Fear of current or ex partner: Not on file    Emotionally abused: Not on file    Physically abused: Not on file    Forced sexual activity: Not on file  Other Topics Concern  . Not on file  Social History Narrative  . Not on file    Allergies:  No Known Allergies  Medications: Prior to Admission medications   Medication Sig Start Date End Date Taking? Authorizing Provider  Prenatal Vit-Fe Fumarate-FA (MULTIVITAMIN-PRENATAL) 27-0.8 MG TABS tablet Take 1 tablet by mouth daily at 12 noon.    [provider]    Physical Exam Vitals:  Vitals:   06/12/18 0909  BP: 108/60   Patient's last menstrual period was 04/02/2018 (exact date).  General: NAD HEENT: normocephalic, anicteric Pulmonary: No increased work of breathing Neurologic: Grossly intact Psychiatric: mood appropriate, affect full  Assessment: 27 y.o. G2P1001 with missed abortion.  Plan: Problem List Items Addressed This Visit    None    Visit Diagnoses    Missed abortion    -  Primary   Relevant Orders   Beta HCG, Quant   Beta HCG, Quant     Patient would like to follow beta HCG levels to see whether they are declining. She would prefer non-intervention, but she also would like to complete the process of  miscarriage. Will notify her or results and discuss further plan after both values are available. She will call with any questions or concerns in the meantime.  Marcelyn BruinsJacelyn Schmid, CNM 06/12/2018  1:23 PM

## 2018-06-13 ENCOUNTER — Telehealth: Payer: Self-pay | Admitting: Obstetrics & Gynecology

## 2018-06-13 ENCOUNTER — Telehealth: Payer: Self-pay

## 2018-06-13 LAB — BETA HCG QUANT (REF LAB): hCG Quant: 24364 m[IU]/mL

## 2018-06-13 NOTE — Telephone Encounter (Signed)
Patient declined 06/18/18 surgery date and requested the following week. Patient is aware of H&P at [facility] on Monday, 06/17/18 @ 9:40am w/ Dr Tiburcio PeaHarris, Pre-admit Testing phone interview to be scheduled (patient will watch for notification on MyChart) and OR on 06/25/18. Patient is aware she may receive calls [facility]  and the [facility]. Patient said she does not take any medications. Patient confirmed Medicaid and no secondary insurance. Patient was offered to speak to Dr Tiburcio PeaHarris or his nurse for signs/symptoms to watch for, but declined, and said she has no symptoms. Patient is aware she can call the office and speak to a nurse at any time.

## 2018-06-13 NOTE — Telephone Encounter (Signed)
Pt needs to schedule appointment for her procedure. She works in the pm at 4:30 but is available any other time. Cb#[phone]

## 2018-06-13 NOTE — Telephone Encounter (Signed)
Can schedule D&C for 11/26 and thus need preop tomorrow Does she want this?

## 2018-06-13 NOTE — Telephone Encounter (Signed)
-----   Message from Nadara Mustardobert P Harris, MD sent at 06/13/2018  4:19 PM EST ----- Regarding: surgery Surgery Booking Request Patient Full Name:  Tara Romero  MRN: [id number]  DOB: 02/27/1991  Surgeon: Letitia Libraobert Paul Harris, MD  Requested Surgery Date and Time: 11/26 Primary Diagnosis AND Code: Missed abortion Secondary Diagnosis and Code:  Surgical Procedure: Suction D&C L&D Notification: No Admission Status: same day surgery Length of Surgery: 20 min Special Case Needs: no H&P: yes Phone Interview???: yes Interpreter: Language:  Medical Clearance: no Special Scheduling Instructions: no  Let pt know and sch preop w me tomorrow

## 2018-06-14 ENCOUNTER — Other Ambulatory Visit: Payer: Medicaid Other

## 2018-06-17 ENCOUNTER — Encounter: Payer: Self-pay | Admitting: Obstetrics & Gynecology

## 2018-06-17 ENCOUNTER — Ambulatory Visit (INDEPENDENT_AMBULATORY_CARE_PROVIDER_SITE_OTHER): Payer: Medicaid Other | Admitting: Obstetrics & Gynecology

## 2018-06-17 VITALS — BP 102/60 | Ht 64.0 in | Wt 144.0 lb

## 2018-06-17 DIAGNOSIS — O021 Missed abortion: Secondary | ICD-10-CM | POA: Diagnosis not present

## 2018-06-17 NOTE — H&P (View-Only) (Signed)
PRE-OPERATIVE HISTORY AND PHYSICAL EXAM  HPI:  Tara Romero is a 27 y.o. G2P1001 Patient's last menstrual period was 04/02/2018 (exact date).; she is being admitted for surgery related to missed abortion, with known pregnancy demise by labs and ultrasound.  She has only just started to have light bleeding today.  No prior pain or bleeding.  PMHx: History reviewed. No pertinent past medical history. Past Surgical History:  Procedure Laterality Date  . CESAREAN SECTION N/A 04/08/2015   Procedure: CESAREAN SECTION;  Surgeon: Vena AustriaAndreas Staebler, MD;  Location: [facility] ORS;  Service: Obstetrics;  Laterality: N/A;   History reviewed. No pertinent family history. Social History   Tobacco Use  . Smoking status: Never Smoker  . Smokeless tobacco: Never Used  Substance Use Topics  . Alcohol use: No  . Drug use: No   No current outpatient medications on file. Allergies: Patient has no known allergies.  Review of Systems  Constitutional: Negative for chills, fever and malaise/fatigue.  HENT: Negative for congestion, sinus pain and sore throat.   Eyes: Negative for blurred vision and pain.  Respiratory: Negative for cough and wheezing.   Cardiovascular: Negative for chest pain and leg swelling.  Gastrointestinal: Negative for abdominal pain, constipation, diarrhea, heartburn, nausea and vomiting.  Genitourinary: Negative for dysuria, frequency, hematuria and urgency.  Musculoskeletal: Negative for back pain, joint pain, myalgias and neck pain.  Skin: Negative for itching and rash.  Neurological: Negative for dizziness, tremors and weakness.  Endo/Heme/Allergies: Does not bruise/bleed easily.  Psychiatric/Behavioral: Negative for depression. The patient is not nervous/anxious and does not have insomnia.     Objective: BP 102/60   Ht 5\' 4"  (1.626 m)   Wt 144 lb (65.3 kg)   LMP 04/02/2018 (Exact Date)   BMI 24.72 kg/m   Filed Weights   06/17/18 0951  Weight: 144 lb (65.3 kg)    Physical Exam  Constitutional: She is oriented to person, place, and time. She appears well-developed and well-nourished. No distress.  HENT:  Head: Normocephalic and atraumatic. Head is without laceration.  Right Ear: Hearing normal.  Left Ear: Hearing normal.  Nose: No epistaxis.  No foreign bodies.  Mouth/Throat: Uvula is midline, oropharynx is clear and moist and mucous membranes are normal.  Eyes: Pupils are equal, round, and reactive to light.  Neck: Normal range of motion. Neck supple. No thyromegaly present.  Cardiovascular: Normal rate and regular rhythm. Exam reveals no gallop and no friction rub.  No murmur heard. Pulmonary/Chest: Effort normal and breath sounds normal. No respiratory distress. She has no wheezes. Right breast exhibits no mass, no skin change and no tenderness. Left breast exhibits no mass, no skin change and no tenderness.  Abdominal: Soft. Bowel sounds are normal. She exhibits no distension. There is no tenderness. There is no rebound.  Musculoskeletal: Normal range of motion.  Neurological: She is alert and oriented to person, place, and time. No cranial nerve deficit.  Skin: Skin is warm and dry.  Psychiatric: She has a normal mood and affect. Judgment normal.  Vitals reviewed.   Assessment: 1. Missed abortion   Options discussed         Condolences were offered to the patient and her family.  I stressed that while emotionally difficult, that this did not occur because of an actions or inactions by the patient.  Somewhere between 10-20% of identified first trimester pregnancies will unfortunately end in miscarriage.  Given this relatively high incidence rate, further diagnostic testing such as chromosome  analysis is generally not clinically relevant nor recommended.  Although the chromosomal abnormalities have been implicated at rates as high as 70% in some studies, these are generally random and do not infer and increased risk of recurrence with subsequent  pregnancies.  However, 3 or more consecutive first trimester losses are relatively uncommon, and these patient generally do benefit from additional work up to determine a potential modifiable etiology.              We briefly discussed management options including expectant management, medical management, and surgical management as well as their relative success rates and complications. Approximately 80% of first trimester miscarriages will pass successfully but may require a time frame of up to 8 weeks (ACOG Practice Bulletin 150 May 2015 "Early Pregnancy Loss").  Medical management has literature supporting its use up to 63 days or [redacted]w[redacted]d gestation and results in a passage rate of 84-85% Environmental education officer Bulletin 143 March 2014 "Medical Management of First-Trimester Abortion").  Dilation and curettage has the highest rate of uterine evacuation, but carries with is operative cost, surgical and anesthetic risk.  While these risk are relatively small they nevertheless include infection, bleeding, uterine perforation, formation of uterine synechia, and in rare cases death.              We discussed repeat ultrasound and or trending HCG levels if the patient wishes to pursue these prior to making her decision.  Clinically I am confident of the diagnosis, but I do not want any doubts in the patient's mind regarding the plan of management she chooses to adopt. The patient elects to proceed with surgical management for her missed abortion. I have discussed with the patient the indications for the procedure. Included in the discussion were the options of therapy, as wall as their individual risks, benefits, and complications. Ample time was given to answer all questions.   While the incidence is low, the risks from this surgery include, but are not limited to, the risks of anesthesia, hemorrhage, infection, perforation, and injury to adjacent structures including bowel, bladder and blood vessels.   Annamarie Major, MD,  [facility], [facility] 06/17/2018  10:13 AM

## 2018-06-17 NOTE — Patient Instructions (Signed)
General Gynecological Post-Operative Instructions You may expect to feel dizzy, weak, and drowsy for as long as 24 hours after receiving the medicine that made you sleep (anesthetic).  Do not drive a car, ride a bicycle, participate in physical activities, or take public transportation until you are done taking narcotic pain medicines or as directed by your doctor.  Do not drink alcohol or take tranquilizers.  Do not take medicine that has not been prescribed by your doctor.  Do not sign important papers or make important decisions while on narcotic pain medicines.  Have a responsible person with you.  CARE OF INCISION  Take showers instead of baths until your doctor gives you permission to take baths.  No sexual intercourse or placement of anything in the vagina for 1 weeks or as instructed by your doctor. Only take prescription or over-the-counter medicines  for pain, discomfort, or fever as directed by your doctor. Do not take aspirin. It can make you bleed. Take medicines (antibiotics) that kill germs if they are prescribed for you.  Call the office or go to the ER if:  You feel sick to your stomach (nauseous) and you start to throw up (vomit).  You have trouble eating or drinking.  You have an oral temperature above 101.  You have constipation that is not helped by adjusting diet or increasing fluid intake. Pain medicines are a common cause of constipation.  You have any other concerns. SEEK IMMEDIATE MEDICAL CARE IF:  You have persistent dizziness.  You have difficulty breathing or a congested sounding (croupy) cough.  You have an oral temperature above 102.5, not controlled by medicine.  There is increasing pain or tenderness near or in the surgical site.      

## 2018-06-17 NOTE — Progress Notes (Signed)
   PRE-OPERATIVE HISTORY AND PHYSICAL EXAM  HPI:  Tara Romero is a 27 y.o. G2P1001 Patient's last menstrual period was 04/02/2018 (exact date).; she is being admitted for surgery related to missed abortion, with known pregnancy demise by labs and ultrasound.  She has only just started to have light bleeding today.  No prior pain or bleeding.  PMHx: History reviewed. No pertinent past medical history. Past Surgical History:  Procedure Laterality Date  . CESAREAN SECTION N/A 04/08/2015   Procedure: CESAREAN SECTION;  Surgeon: Andreas Staebler, MD;  Location: [facility] ORS;  Service: Obstetrics;  Laterality: N/A;   History reviewed. No pertinent family history. Social History   Tobacco Use  . Smoking status: Never Smoker  . Smokeless tobacco: Never Used  Substance Use Topics  . Alcohol use: No  . Drug use: No   No current outpatient medications on file. Allergies: Patient has no known allergies.  Review of Systems  Constitutional: Negative for chills, fever and malaise/fatigue.  HENT: Negative for congestion, sinus pain and sore throat.   Eyes: Negative for blurred vision and pain.  Respiratory: Negative for cough and wheezing.   Cardiovascular: Negative for chest pain and leg swelling.  Gastrointestinal: Negative for abdominal pain, constipation, diarrhea, heartburn, nausea and vomiting.  Genitourinary: Negative for dysuria, frequency, hematuria and urgency.  Musculoskeletal: Negative for back pain, joint pain, myalgias and neck pain.  Skin: Negative for itching and rash.  Neurological: Negative for dizziness, tremors and weakness.  Endo/Heme/Allergies: Does not bruise/bleed easily.  Psychiatric/Behavioral: Negative for depression. The patient is not nervous/anxious and does not have insomnia.     Objective: BP 102/60   Ht 5' 4" (1.626 m)   Wt 144 lb (65.3 kg)   LMP 04/02/2018 (Exact Date)   BMI 24.72 kg/m   Filed Weights   06/17/18 0951  Weight: 144 lb (65.3 kg)    Physical Exam  Constitutional: She is oriented to person, place, and time. She appears well-developed and well-nourished. No distress.  HENT:  Head: Normocephalic and atraumatic. Head is without laceration.  Right Ear: Hearing normal.  Left Ear: Hearing normal.  Nose: No epistaxis.  No foreign bodies.  Mouth/Throat: Uvula is midline, oropharynx is clear and moist and mucous membranes are normal.  Eyes: Pupils are equal, round, and reactive to light.  Neck: Normal range of motion. Neck supple. No thyromegaly present.  Cardiovascular: Normal rate and regular rhythm. Exam reveals no gallop and no friction rub.  No murmur heard. Pulmonary/Chest: Effort normal and breath sounds normal. No respiratory distress. She has no wheezes. Right breast exhibits no mass, no skin change and no tenderness. Left breast exhibits no mass, no skin change and no tenderness.  Abdominal: Soft. Bowel sounds are normal. She exhibits no distension. There is no tenderness. There is no rebound.  Musculoskeletal: Normal range of motion.  Neurological: She is alert and oriented to person, place, and time. No cranial nerve deficit.  Skin: Skin is warm and dry.  Psychiatric: She has a normal mood and affect. Judgment normal.  Vitals reviewed.   Assessment: 1. Missed abortion   Options discussed         Condolences were offered to the patient and her family.  I stressed that while emotionally difficult, that this did not occur because of an actions or inactions by the patient.  Somewhere between 10-20% of identified first trimester pregnancies will unfortunately end in miscarriage.  Given this relatively high incidence rate, further diagnostic testing such as chromosome   analysis is generally not clinically relevant nor recommended.  Although the chromosomal abnormalities have been implicated at rates as high as 70% in some studies, these are generally random and do not infer and increased risk of recurrence with subsequent  pregnancies.  However, 3 or more consecutive first trimester losses are relatively uncommon, and these patient generally do benefit from additional work up to determine a potential modifiable etiology.              We briefly discussed management options including expectant management, medical management, and surgical management as well as their relative success rates and complications. Approximately 80% of first trimester miscarriages will pass successfully but may require a time frame of up to 8 weeks (ACOG Practice Bulletin 150 May 2015 "Early Pregnancy Loss").  Medical management has literature supporting its use up to 63 days or [redacted]w[redacted]d gestation and results in a passage rate of 84-85% (ACOG Practice Bulletin 143 March 2014 "Medical Management of First-Trimester Abortion").  Dilation and curettage has the highest rate of uterine evacuation, but carries with is operative cost, surgical and anesthetic risk.  While these risk are relatively small they nevertheless include infection, bleeding, uterine perforation, formation of uterine synechia, and in rare cases death.              We discussed repeat ultrasound and or trending HCG levels if the patient wishes to pursue these prior to making her decision.  Clinically I am confident of the diagnosis, but I do not want any doubts in the patient's mind regarding the plan of management she chooses to adopt. The patient elects to proceed with surgical management for her missed abortion. I have discussed with the patient the indications for the procedure. Included in the discussion were the options of therapy, as wall as their individual risks, benefits, and complications. Ample time was given to answer all questions.   While the incidence is low, the risks from this surgery include, but are not limited to, the risks of anesthesia, hemorrhage, infection, perforation, and injury to adjacent structures including bowel, bladder and blood vessels.   Paul Breyona Swander, MD,  FACOG [facility], [facility] 06/17/2018  10:13 AM  

## 2018-06-24 ENCOUNTER — Telehealth: Payer: Self-pay

## 2018-06-24 ENCOUNTER — Encounter
Admission: RE | Admit: 2018-06-24 | Discharge: 2018-06-24 | Disposition: A | Discharge status: Home / Self Care | Payer: Medicaid Other | Source: Ambulatory Visit | Attending: Obstetrics & Gynecology | Admitting: Obstetrics & Gynecology

## 2018-06-24 ENCOUNTER — Encounter: Payer: Self-pay | Admitting: *Deleted

## 2018-06-24 ENCOUNTER — Other Ambulatory Visit: Payer: Self-pay

## 2018-06-24 NOTE — Telephone Encounter (Signed)
Pt reports the bleeding is really light no pain . She doesn't  want to have the procedure if she doesn't have to. Pt will do what you  Prefer, please advise

## 2018-06-24 NOTE — Telephone Encounter (Signed)
Pt aware.

## 2018-06-24 NOTE — Telephone Encounter (Signed)
Last beta lab level very high, and as far as long as she was, I would expect her to have had severe pain and bleeding  and signs of tissue if she passed miscarriage herself, so would expect she still needs surgery

## 2018-06-24 NOTE — Patient Instructions (Signed)
Your procedure is scheduled on: 06-25-18 Report to Same Day Surgery 2nd floor medical mall [facility](Medical Mall Entrance-take elevator on left to 2nd floor.  Check in with surgery information desk.) @ 9945 PER PT   Remember: Instructions that are not followed completely may result in serious medical risk, up to and including death, or upon the discretion of your surgeon and anesthesiologist your surgery may need to be rescheduled.    _x___ 1. Do not eat food after midnight the night before your procedure. You may drink clear liquids up to 2 hours before you are scheduled to arrive at the hospital for your procedure.  Do not drink clear liquids within 2 hours of your scheduled arrival to the hospital.  Clear liquids include  --Water or Apple juice without pulp  --Clear carbohydrate beverage such as ClearFast or Gatorade  --Black Coffee or Clear Tea (No milk, no creamers, do not add anything to  the coffee or Tea   ____Ensure clear carbohydrate drink on the way to the hospital for bariatric patients  ____Ensure clear carbohydrate drink 3 hours before surgery for Dr Rutherford NailByrnett's patients if physician instructed.   No gum chewing or hard candies.     __x__ 2. No Alcohol for 24 hours before or after surgery.   __x__3. No Smoking or e-cigarettes for 24 prior to surgery.  Do not use any chewable tobacco products for at least 6 hour prior to surgery   ____  4. Bring all medications with you on the day of surgery if instructed.    __x__ 5. Notify your doctor if there is any change in your medical condition     (cold, fever, infections).    x___6. On the morning of surgery brush your teeth with toothpaste and water.  You may rinse your mouth with mouth wash if you wish.  Do not swallow any toothpaste or mouthwash.   Do not wear jewelry, make-up, hairpins, clips or nail polish.  Do not wear lotions, powders, or perfumes. You may wear deodorant.  Do not shave 48 hours prior to surgery. Men may shave face  and neck.  Do not bring valuables to the hospital.    [facility] belongings or valuables.               Contacts, dentures or bridgework may not be worn into surgery.  Leave your suitcase in the car. After surgery it may be brought to your room.  For patients admitted to the hospital, discharge time is determined by your treatment team.  _  Patients discharged the day of surgery will not be allowed to drive home.  You will need someone to drive you home and stay with you the night of your procedure.    Please read over the following fact sheets that you were given:   [facility] Pre[facility] for [facility] and or MRSA Information   ____ Take anti-hypertensive listed below, cardiac, seizure, asthma, anti-reflux and psychiatric medicines. These include:  1. NONE  2.  3.  4.  5.  6.  ____Fleets enema or Magnesium Citrate as directed.   ____ Use CHG Soap or sage wipes as directed on instruction sheet   ____ Use inhalers on the day of surgery and bring to hospital day of surgery  ____ Stop Metformin and Janumet 2 days prior to surgery.    ____ Take 1/2 of usual insulin dose the night before surgery and none on the morning surgery.   ____  Follow recommendations from Cardiologist, Pulmonologist or PCP regarding stopping Aspirin, Coumadin, Plavix ,Eliquis, Effient, or Pradaxa, and Pletal.  X____Stop Anti-inflammatories such as Advil, Aleve, Ibuprofen, Motrin, Naproxen, Naprosyn, Goodies powders or aspirin products NOW-OK to take Tylenol    ____ Stop supplements until after surgery.    ____ Bring C-Pap to the hospital.

## 2018-06-25 ENCOUNTER — Ambulatory Visit
Admission: RE | Admit: 2018-06-25 | Discharge: 2018-06-25 | Disposition: A | Discharge status: Home / Self Care | Payer: Medicaid Other | Source: Ambulatory Visit | Attending: Obstetrics & Gynecology | Admitting: Obstetrics & Gynecology

## 2018-06-25 ENCOUNTER — Ambulatory Visit: Payer: Medicaid Other | Admitting: Anesthesiology

## 2018-06-25 ENCOUNTER — Encounter
Admission: RE | Disposition: A | Discharge status: Home / Self Care | Payer: Self-pay | Source: Ambulatory Visit | Attending: Obstetrics & Gynecology

## 2018-06-25 ENCOUNTER — Other Ambulatory Visit: Payer: Self-pay

## 2018-06-25 DIAGNOSIS — O021 Missed abortion: Secondary | ICD-10-CM | POA: Insufficient documentation

## 2018-06-25 DIAGNOSIS — Z3A09 9 weeks gestation of pregnancy: Secondary | ICD-10-CM | POA: Diagnosis not present

## 2018-06-25 DIAGNOSIS — Z3A08 8 weeks gestation of pregnancy: Secondary | ICD-10-CM | POA: Diagnosis not present

## 2018-06-25 HISTORY — PX: DILATION AND EVACUATION: SHX1459

## 2018-06-25 LAB — CBC
HCT: 43.5 % (ref 36.0–46.0)
Hemoglobin: 15.2 g/dL — ABNORMAL HIGH (ref 12.0–15.0)
MCH: 29.9 pg (ref 26.0–34.0)
MCHC: 34.9 g/dL (ref 30.0–36.0)
MCV: 85.5 fL (ref 80.0–100.0)
NRBC: 0 % (ref 0.0–0.2)
PLATELETS: 183 10*3/uL (ref 150–400)
RBC: 5.09 MIL/uL (ref 3.87–5.11)
RDW: 11.6 % (ref 11.5–15.5)
WBC: 7 10*3/uL (ref 4.0–10.5)

## 2018-06-25 LAB — ABO/RH: ABO/RH(D): AB POS

## 2018-06-25 LAB — TYPE AND SCREEN
ABO/RH(D): AB POS
Antibody Screen: NEGATIVE

## 2018-06-25 SURGERY — DILATION AND EVACUATION, UTERUS
Anesthesia: General

## 2018-06-25 MED ORDER — OXYTOCIN 10 UNIT/ML IJ SOLN: INTRAVENOUS | Status: DC | PRN

## 2018-06-25 MED ORDER — LIDOCAINE HCL (PF) 2 % IJ SOLN
INTRAMUSCULAR | Status: AC
  Filled 2018-06-25: qty 10

## 2018-06-25 MED ORDER — MIDAZOLAM HCL 2 MG/2ML IJ SOLN
INTRAMUSCULAR | Status: AC
  Filled 2018-06-25: qty 2

## 2018-06-25 MED ORDER — ONDANSETRON HCL 4 MG/2ML IJ SOLN
INTRAMUSCULAR | Status: AC
  Filled 2018-06-25: qty 2

## 2018-06-25 MED ORDER — FAMOTIDINE 20 MG PO TABS
20.0000 mg | ORAL_TABLET | Freq: Once | ORAL | Status: AC
  Administered 2018-06-25: 20 mg via ORAL

## 2018-06-25 MED ORDER — ACETAMINOPHEN 325 MG PO TABS: 650.0000 mg | ORAL_TABLET | ORAL | Status: DC | PRN

## 2018-06-25 MED ORDER — OXYTOCIN 10 UNIT/ML IJ SOLN
INTRAMUSCULAR | Status: AC
  Filled 2018-06-25: qty 2

## 2018-06-25 MED ORDER — MIDAZOLAM HCL 2 MG/2ML IJ SOLN
INTRAMUSCULAR | Status: DC | PRN
  Administered 2018-06-25: 2 mg via INTRAVENOUS

## 2018-06-25 MED ORDER — METHYLERGONOVINE MALEATE 0.2 MG PO TABS: 0.2000 mg | ORAL_TABLET | Freq: Four times a day (QID) | ORAL | Status: DC

## 2018-06-25 MED ORDER — DEXAMETHASONE SODIUM PHOSPHATE 10 MG/ML IJ SOLN
INTRAMUSCULAR | Status: AC
  Filled 2018-06-25: qty 1

## 2018-06-25 MED ORDER — METHYLERGONOVINE MALEATE 0.2 MG PO TABS: 0.2000 mg | ORAL_TABLET | Freq: Four times a day (QID) | ORAL | 0 refills | Status: AC

## 2018-06-25 MED ORDER — FENTANYL CITRATE (PF) 100 MCG/2ML IJ SOLN
INTRAMUSCULAR | Status: AC
  Filled 2018-06-25: qty 2

## 2018-06-25 MED ORDER — IBUPROFEN 400 MG PO TABS: 400.0000 mg | ORAL_TABLET | Freq: Four times a day (QID) | ORAL | Status: DC | PRN

## 2018-06-25 MED ORDER — ONDANSETRON HCL 4 MG/2ML IJ SOLN
INTRAMUSCULAR | Status: DC | PRN
  Administered 2018-06-25: 4 mg via INTRAVENOUS

## 2018-06-25 MED ORDER — LACTATED RINGERS IV SOLN
INTRAVENOUS | Status: DC
  Administered 2018-06-25 (×2): via INTRAVENOUS

## 2018-06-25 MED ORDER — PROPOFOL 10 MG/ML IV BOLUS
INTRAVENOUS | Status: AC
  Filled 2018-06-25: qty 20

## 2018-06-25 MED ORDER — FENTANYL CITRATE (PF) 100 MCG/2ML IJ SOLN
25.0000 ug | INTRAMUSCULAR | Status: DC | PRN
  Administered 2018-06-25: 25 ug via INTRAVENOUS

## 2018-06-25 MED ORDER — LACTATED RINGERS IV SOLN: INTRAVENOUS | Status: DC

## 2018-06-25 MED ORDER — DOXYCYCLINE HYCLATE 100 MG PO TABS: 100.0000 mg | ORAL_TABLET | Freq: Two times a day (BID) | ORAL | Status: DC

## 2018-06-25 MED ORDER — LIDOCAINE HCL (CARDIAC) PF 100 MG/5ML IV SOSY
PREFILLED_SYRINGE | INTRAVENOUS | Status: DC | PRN
  Administered 2018-06-25: 100 mg via INTRAVENOUS

## 2018-06-25 MED ORDER — KETOROLAC TROMETHAMINE 30 MG/ML IJ SOLN: 30.0000 mg | Freq: Four times a day (QID) | INTRAMUSCULAR | Status: DC

## 2018-06-25 MED ORDER — ACETAMINOPHEN 650 MG RE SUPP
650.0000 mg | RECTAL | Status: DC | PRN
  Filled 2018-06-25: qty 1

## 2018-06-25 MED ORDER — DEXAMETHASONE SODIUM PHOSPHATE 10 MG/ML IJ SOLN
INTRAMUSCULAR | Status: DC | PRN
  Administered 2018-06-25: 10 mg via INTRAVENOUS

## 2018-06-25 MED ORDER — FENTANYL CITRATE (PF) 100 MCG/2ML IJ SOLN
INTRAMUSCULAR | Status: DC | PRN
  Administered 2018-06-25 (×4): 25 ug via INTRAVENOUS

## 2018-06-25 MED ORDER — PROPOFOL 10 MG/ML IV BOLUS
INTRAVENOUS | Status: DC | PRN
  Administered 2018-06-25: 150 mg via INTRAVENOUS

## 2018-06-25 MED ORDER — ONDANSETRON HCL 4 MG/2ML IJ SOLN: 4.0000 mg | Freq: Once | INTRAMUSCULAR | Status: DC | PRN

## 2018-06-25 MED ORDER — OXYTOCIN 40 UNITS IN LACTATED RINGERS INFUSION - SIMPLE MED
INTRAVENOUS | Status: DC | PRN
  Administered 2018-06-25: 500 mL via INTRAVENOUS

## 2018-06-25 MED ORDER — MORPHINE SULFATE (PF) 4 MG/ML IV SOLN: 1.0000 mg | INTRAVENOUS | Status: DC | PRN

## 2018-06-25 MED ORDER — IBUPROFEN 400 MG PO TABS: 400.0000 mg | ORAL_TABLET | Freq: Four times a day (QID) | ORAL | 0 refills | Status: DC | PRN

## 2018-06-25 MED ORDER — DOXYCYCLINE HYCLATE 100 MG PO TABS: 100.0000 mg | ORAL_TABLET | Freq: Two times a day (BID) | ORAL | 0 refills | Status: AC

## 2018-06-25 MED ORDER — FAMOTIDINE 20 MG PO TABS
ORAL_TABLET | ORAL | Status: AC
  Filled 2018-06-25: qty 1

## 2018-06-25 SURGICAL SUPPLY — 23 items
BAG COUNTER SPONGE EZ (MISCELLANEOUS) IMPLANT
CANISTER SUC SOCK COL 7IN (MISCELLANEOUS) IMPLANT
CATH ROBINSON RED A/P 16FR (CATHETERS) ×3 IMPLANT
COUNTER SPONGE BAG EZ (MISCELLANEOUS)
COVER WAND RF STERILE (DRAPES) ×3 IMPLANT
FILTER UTR ASPR SPEC (MISCELLANEOUS) ×1 IMPLANT
FLTR UTR ASPR SPEC (MISCELLANEOUS) ×3
GLOVE BIO SURGEON STRL SZ8 (GLOVE) ×3 IMPLANT
GOWN STRL REUS W/ TWL LRG LVL3 (GOWN DISPOSABLE) ×1 IMPLANT
GOWN STRL REUS W/ TWL XL LVL3 (GOWN DISPOSABLE) ×1 IMPLANT
GOWN STRL REUS W/TWL LRG LVL3 (GOWN DISPOSABLE) ×2
GOWN STRL REUS W/TWL XL LVL3 (GOWN DISPOSABLE) ×2
KIT BERKELEY 1ST TRIMESTER 3/8 (MISCELLANEOUS) ×3 IMPLANT
KIT TURNOVER CYSTO (KITS) ×3 IMPLANT
NS IRRIG 500ML POUR BTL (IV SOLUTION) ×3 IMPLANT
PACK DNC HYST (MISCELLANEOUS) ×3 IMPLANT
PAD OB MATERNITY 4.3X12.25 (PERSONAL CARE ITEMS) ×3 IMPLANT
PAD PREP 24X41 OB/GYN DISP (PERSONAL CARE ITEMS) ×3 IMPLANT
SET BERKELEY SUCTION TUBING (SUCTIONS) ×3 IMPLANT
TOWEL OR 17X26 4PK STRL BLUE (TOWEL DISPOSABLE) ×3 IMPLANT
VACURETTE 10 RIGID CVD (CANNULA) IMPLANT
VACURETTE 12 RIGID CVD (CANNULA) IMPLANT
VACURETTE 8 RIGID CVD (CANNULA) ×3 IMPLANT

## 2018-06-25 NOTE — Op Note (Signed)
  Operative Note  06/25/2018 11:41 AM  PRE-OP DIAGNOSIS: missed abortion   POST-OP DIAGNOSIS: same  SURGEON: Annamarie MajorPaul Harris, MD, FACOG  ANESTHESIA: Choice   PROCEDURE: Procedure(s): DILATATION AND EVACUATION   ESTIMATED BLOOD LOSS: 500 mL   SPECIMENS: POC   COMPLICATIONS: none  DISPOSITION: PACU - hemodynamically stable.  CONDITION: stable  FINDINGS: Exam under anesthesia revealed a 8 wk size uterus without palpable adnexal masses.   INDICATION FOR PROCEDURE: Missed abortion based on beta hCG, ultrasound, timing, and symptoms.  PROCEDURE IN DETAIL: After informed consent was obtained, the patient was taken to the operating room where anesthesia was obtained without difficulty. The patient was positioned in the dorsal lithotomy position with [employer]. Time out was performed and an exam under anesthesia was performed. The vagina, perineum, and lower abdomen were prepped and draped in a normal sterile fashion. The bladder was emptied with an I&O catheter. A speculum was placed into the vagina and the cervix was grasped with a single toothed tenaculum. The uterus was sounded to 12cm.  The cervix was already dilated to 20 [place]. The suction was then tested and found to be adequate, and a 8mm rigid suction cannula was advanced into the uterine cavity. The suction was activated and the contents of the uterus were aspirated until no further tissue was obtained. The uterus was then curetted to gritty texture throughout.  At the end of the procedure bleeding was noted to be Moderate, and monitored with pressure applied until bleeding light.  All instruments were then removed from the vagina.The patient tolerated the procedure well. All sponge, instrument, and needle counts were correct. The patient was taken to the recovery room in good condition.   Annamarie MajorPaul Harris, MD, Merlinda FrederickFACOG [facility], [facility] 06/25/2018  11:41 AM

## 2018-06-25 NOTE — Anesthesia Procedure Notes (Signed)
Procedure Name: LMA Insertion Date/Time: 06/25/2018 10:56 AM Performed by: Oletta LamasBlackwell, Jadene Stemmer Delores, CRNA Pre-anesthesia Checklist: Patient identified, Emergency Drugs available, Suction available, Patient being monitored and Timeout performed Patient Re-evaluated:Patient Re-evaluated prior to induction Oxygen Delivery Method: Circle system utilized Preoxygenation: Pre-oxygenation with 100% oxygen Induction Type: IV induction Ventilation: Mask ventilation without difficulty LMA: LMA inserted LMA Size: 4.0 Number of attempts: 1 Tube secured with: Tape Dental Injury: Teeth and Oropharynx as per pre-operative assessment

## 2018-06-25 NOTE — Anesthesia Postprocedure Evaluation (Signed)
Anesthesia Post Note  Patient: Tara Romero  Procedure(s) Performed: DILATATION AND EVACUATION (N/A )  Patient location during evaluation: PACU Anesthesia Type: General Level of consciousness: awake and alert and oriented Pain management: pain level controlled Vital Signs Assessment: post-procedure vital signs reviewed and stable Respiratory status: spontaneous breathing Cardiovascular status: blood pressure returned to baseline Anesthetic complications: no     Last Vitals:  Vitals:   06/25/18 1150 06/25/18 1207  BP: 119/71 113/72  Pulse:  78  Resp: 13 15  Temp: (!) 36 C   SpO2:  99%    Last Pain:  Vitals:   06/25/18 1207  TempSrc:   PainSc: 6                  Daeshaun Specht

## 2018-06-25 NOTE — Addendum Note (Signed)
Addendum  created 06/25/18 1318 by Oletta LamasBlackwell, Artyom Stencel Delores, CRNA   Intraprocedure Flowsheets edited

## 2018-06-25 NOTE — Anesthesia Post-op Follow-up Note (Signed)
Anesthesia QCDR form completed.        

## 2018-06-25 NOTE — Transfer of Care (Signed)
Immediate Anesthesia Transfer of Care Note  Patient: Tara Romero  Procedure(s) Performed: DILATATION AND EVACUATION (N/A )  Patient Location: PACU  Anesthesia Type:General  Level of Consciousness: awake  Airway & Oxygen Therapy: Patient Spontanous Breathing  Post-op Assessment: Post -op Vital signs reviewed and stable  Post vital signs: stable  Last Vitals:  Vitals Value Taken Time  BP 119/71 06/25/2018 11:51 AM  Temp 36 C 06/25/2018 11:50 AM  Pulse 87 06/25/2018 11:57 AM  Resp 15 06/25/2018 11:57 AM  SpO2 100 % 06/25/2018 11:57 AM  Vitals shown include unvalidated device data.  Last Pain:  Vitals:   06/25/18 0948  TempSrc: Temporal  PainSc: 3          Complications: No apparent anesthesia complications

## 2018-06-25 NOTE — Discharge Instructions (Signed)
General Gynecological Post-Operative Instructions °You may expect to feel dizzy, weak, and drowsy for as long as 24 hours after receiving the medicine that made you sleep (anesthetic).  °Do not drive a car, ride a bicycle, participate in physical activities, or take public transportation until you are done taking narcotic pain medicines or as directed by your doctor.  °Do not drink alcohol or take tranquilizers.  °Do not take medicine that has not been prescribed by your doctor.  °Do not sign important papers or make important decisions while on narcotic pain medicines.  °Have a responsible person with you.  °CARE OF INCISION  °Keep incision clean and dry. °Take showers instead of baths until your doctor gives you permission to take baths.  °Avoid heavy lifting (more than 10 pounds/4.5 kilograms), pushing, or pulling.  °Avoid activities that may risk injury to your surgical site.  °No sexual intercourse or placement of anything in the vagina for 2 weeks or as instructed by your doctor. °If you have tubes coming from the wound site, check with your doctor regarding appropriate care of the tubes. °Only take prescription or over-the-counter medicines  for pain, discomfort, or fever as directed by your doctor. Do not take aspirin. It can make you bleed. Take medicines (antibiotics) that kill germs if they are prescribed for you.  °Call the office or go to the ER if:  °You feel sick to your stomach (nauseous) and you start to throw up (vomit).  °You have trouble eating or drinking.  °You have an oral temperature above 101.  °You have constipation that is not helped by adjusting diet or increasing fluid intake. Pain medicines are a common cause of constipation.  °You have any other concerns. °SEEK IMMEDIATE MEDICAL CARE IF:  °You have persistent dizziness.  °You have difficulty breathing or a congested sounding (croupy) cough.  °You have an oral temperature above 102.5, not controlled by medicine.  °There is increasing  pain or tenderness near or in the surgical site.  ° °AMBULATORY SURGERY  °DISCHARGE INSTRUCTIONS ° ° °1) The drugs that you were given will stay in your system until tomorrow so for the next 24 hours you should not: ° °A) Drive an automobile °B) Make any legal decisions °C) Drink any alcoholic beverage ° ° °2) You may resume regular meals tomorrow.  Today it is better to start with liquids and gradually work up to solid foods. ° °You may eat anything you prefer, but it is better to start with liquids, then soup and crackers, and gradually work up to solid foods. ° ° °3) Please notify your doctor immediately if you have any unusual bleeding, trouble breathing, redness and pain at the surgery site, drainage, fever, or pain not relieved by medication. ° ° ° °4) Additional Instructions: ° ° ° ° ° ° ° °Please contact your physician with any problems or Same Day Surgery at [phone], Monday through Friday 6 am to 4 pm, or [place] [facility] [place] Main number at [phone]. ° ° °

## 2018-06-25 NOTE — Addendum Note (Signed)
Addendum  created 06/25/18 1227 by Irving BurtonBachich, Mckoy Bhakta, CRNA   Intraprocedure Meds edited

## 2018-06-25 NOTE — Interval H&P Note (Signed)
History and Physical Interval Note:  06/25/2018 10:17 AM  Erenest RasherSheridan F Bahner  has presented today for surgery, with the diagnosis of missed abortion  The various methods of treatment have been discussed with the patient and family. After consideration of risks, benefits and other options for treatment, the patient has consented to  Procedure(s): DILATATION AND EVACUATION (N/A) as a surgical intervention .  The patient's history has been reviewed, patient examined, no change in status, stable for surgery.  I have reviewed the patient's chart and labs.  Questions were answered to the patient's satisfaction.     Letitia Libraobert Paul Cherokee Clowers

## 2018-06-25 NOTE — Anesthesia Preprocedure Evaluation (Signed)
Anesthesia Evaluation  Patient identified by MRN, date of birth, ID band Patient awake    Reviewed: Allergy & Precautions, H&P , NPO status , Patient's Chart, lab work & pertinent test results, reviewed documented beta blocker date and time   History of Anesthesia Complications Negative for: history of anesthetic complications  Airway Mallampati: II  TM Distance: >3 FB Neck ROM: full    Dental no notable dental hx.    Pulmonary neg pulmonary ROS,    Pulmonary exam normal breath sounds clear to auscultation       Cardiovascular Exercise Tolerance: Good negative cardio ROS Normal cardiovascular exam Rhythm:regular Rate:Normal     Neuro/Psych negative neurological ROS  negative psych ROS   GI/Hepatic negative GI ROS, Neg liver ROS,   Endo/Other  negative endocrine ROS  Renal/GU negative Renal ROS  negative genitourinary   Musculoskeletal   Abdominal   Peds  Hematology negative hematology ROS (+)   Anesthesia Other Findings History reviewed. No pertinent past medical history.   Reproductive/Obstetrics negative OB ROS                             Anesthesia Physical  Anesthesia Plan  ASA: II  Anesthesia Plan: General   Post-op Pain Management:    Induction: Intravenous  PONV Risk Score and Plan:   Airway Management Planned: LMA and Oral ETT  Additional Equipment:   Intra-op Plan:   Post-operative Plan: Extubation in OR  Informed Consent: I have reviewed the patients History and Physical, chart, labs and discussed the procedure including the risks, benefits and alternatives for the proposed anesthesia with the patient or authorized representative who has indicated his/her understanding and acceptance.   Dental Advisory Given  Plan Discussed with: Anesthesiologist, CRNA and Surgeon  Anesthesia Plan Comments:         Anesthesia Quick Evaluation

## 2018-06-26 ENCOUNTER — Encounter: Payer: Self-pay | Admitting: Obstetrics & Gynecology

## 2018-06-26 LAB — SURGICAL PATHOLOGY

## 2018-07-09 ENCOUNTER — Ambulatory Visit: Payer: Medicaid Other | Admitting: Obstetrics & Gynecology

## 2018-10-17 ENCOUNTER — Other Ambulatory Visit: Payer: Self-pay | Admitting: Obstetrics and Gynecology

## 2018-10-17 ENCOUNTER — Telehealth: Payer: Self-pay

## 2018-10-17 DIAGNOSIS — R300 Dysuria: Secondary | ICD-10-CM

## 2018-10-17 NOTE — Progress Notes (Signed)
Patient called triage line with dysuria and back pain.  No history of UTI in past.  Will send urine culture.

## 2018-10-17 NOTE — Telephone Encounter (Signed)
So that would be a primary care or urgent care visit.  If it is a kidney infection that generally requires IV antibiotics.

## 2018-10-17 NOTE — Telephone Encounter (Signed)
lvm to advise pt to call pcp or urgent care

## 2018-10-17 NOTE — Telephone Encounter (Signed)
Pt called back reporting she has Never had a UTI or Kidney infection, just going on what her sister has told her, she states she had some dysuria and feeling or urgency, can she drop off a sample and we do a urinalysis? Pt does not have pcp only been here for everything

## 2018-10-17 NOTE — Telephone Encounter (Signed)
Pt states she does not have insurance, I advised her of the 78.75 self pay price. She will find out about her insurance

## 2018-10-17 NOTE — Telephone Encounter (Signed)
Pt calling triage line stating she is pretty sure she has a uti or kidney infection, reports pain in back. Can we call in a rx or does she need a appointment? Please advise

## 2018-10-17 NOTE — Telephone Encounter (Signed)
I would want her to drop of a urine if she has never had a UTI, I put in an order for a urine culture.  I will contact her once resulted

## 2018-11-19 ENCOUNTER — Telehealth: Payer: Self-pay | Admitting: Obstetrics and Gynecology

## 2018-11-19 NOTE — Telephone Encounter (Signed)
Having increased d/c with fishy odor for 2-3 wks. Hx of BV in past. Found out she was pregnant a couple days ago. Has newer sex partner. Wants to be seen for STD testing/ discharge eval. Has NOB 11/27/18. Hx of miscarriage 11/19. Pt transferred to sched appt for pelvic.

## 2018-11-20 ENCOUNTER — Other Ambulatory Visit (HOSPITAL_COMMUNITY)
Admission: RE | Admit: 2018-11-20 | Discharge: 2018-11-20 | Disposition: A | Discharge status: Home / Self Care | Payer: Medicaid Other | Source: Ambulatory Visit | Attending: Obstetrics & Gynecology | Admitting: Obstetrics & Gynecology

## 2018-11-20 ENCOUNTER — Other Ambulatory Visit: Payer: Self-pay

## 2018-11-20 ENCOUNTER — Ambulatory Visit (INDEPENDENT_AMBULATORY_CARE_PROVIDER_SITE_OTHER): Payer: Medicaid Other | Admitting: Obstetrics & Gynecology

## 2018-11-20 ENCOUNTER — Encounter: Payer: Self-pay | Admitting: Obstetrics & Gynecology

## 2018-11-20 VITALS — BP 120/80 | Ht 65.0 in | Wt 145.0 lb

## 2018-11-20 DIAGNOSIS — N926 Irregular menstruation, unspecified: Secondary | ICD-10-CM

## 2018-11-20 DIAGNOSIS — N898 Other specified noninflammatory disorders of vagina: Secondary | ICD-10-CM | POA: Diagnosis not present

## 2018-11-20 NOTE — Progress Notes (Signed)
HPI:      [name] is a 28 y.o. G2P1001 who LMP was Patient's last menstrual period was 10/19/2018., presents today for multiple problems visit.  She complains of:  Vaginitis: Patient complains of an abnormal vaginal odor for 3 weeks. Vaginal symptoms include no itching or burning or dischatge, which is why she has waited to be seen for this long of time.Vulvar symptoms include none.STI Risk: Very low risk of STD exposureDischarge described as: normal and physiologic.Other associated symptoms: odor.Menstrual pattern: She had been bleeding regularly. Contraception: none  Missed Period: Pt has had pos uCG preg test at home (4 times) as she just missed her period date this week. Last time this happened in Nov 2019 it resulted in Missed ab eventually leading to need for D&C. Pt feels pregnant by abdominal "showing"; no nausea or breast T or VB.  PMHx: She  has no past medical history on file. Also,  has a past surgical history that includes Cesarean section (N/A, 04/08/2015) and Dilation and evacuation (N/A, 06/25/2018)., family history is not on file.,  reports that she has never smoked. She has never used smokeless tobacco. She reports that she does not drink alcohol or use drugs.  She has a current medication list which includes the following prescription(s): ibuprofen. Also, has No Known Allergies.  Review of Systems  Constitutional: Negative for chills, fever and malaise/fatigue.  HENT: Negative for congestion, sinus pain and sore throat.   Eyes: Negative for blurred vision and pain.  Respiratory: Negative for cough and wheezing.   Cardiovascular: Negative for chest pain and leg swelling.  Gastrointestinal: Negative for abdominal pain, constipation, diarrhea, heartburn, nausea and vomiting.  Genitourinary: Negative for dysuria, frequency, hematuria and urgency.  Musculoskeletal: Negative for back pain, joint pain, myalgias and neck pain.  Skin: Negative for itching and rash.   Neurological: Negative for dizziness, tremors and weakness.  Endo/Heme/Allergies: Does not bruise/bleed easily.  Psychiatric/Behavioral: Negative for depression. The patient is not nervous/anxious and does not have insomnia.     Objective: BP 120/80   Ht 5\' 5"  (1.651 m)   Wt 145 lb (65.8 kg)   LMP 10/19/2018   BMI 24.13 kg/m  Physical Exam Constitutional:      General: She is not in acute distress.    Appearance: She is well-developed.  Genitourinary:     Pelvic exam was performed with patient supine.     Vagina and uterus normal.     No vaginal erythema or bleeding.     No cervical motion tenderness, discharge, polyp or nabothian cyst.     Uterus is mobile.     Uterus is not enlarged.     No uterine mass detected.    Uterus is midaxial.     No right or left adnexal mass present.     Right adnexa not tender.     Left adnexa not tender.  HENT:     Head: Normocephalic and atraumatic.     Nose: Nose normal.  Abdominal:     General: There is no distension.     Palpations: Abdomen is soft.     Tenderness: There is no abdominal tenderness.  Musculoskeletal: Normal range of motion.  Neurological:     Mental Status: She is alert and oriented to person, place, and time.     Cranial Nerves: No cranial nerve deficit.  Skin:    General: Skin is warm and dry.     ASSESSMENT/PLAN:    Problem List Items Addressed  This Visit    Missed period  Also, history of miscarriage Also, history of Cesarean Section   Relevant Orders   Beta hCG quant (ref lab) Prenatal Vitamins advised/ prescribed Activity restrictions discussed   Vaginal odor       Relevant Orders   Cervicovaginal ancillary     Exam and cultures done today.  Further exam as warranted.  No s/sx infection but sill await testing.  Monitor for changes in hygiene, behaviors, otherwise that could affect pH balance.  Beta hCG testing today as baseline due to prior h/o missed abortion 2019.  Plan NOB as scheduled (tele OK),  then later [place] to be timed around [redacted] weeks EGA.  Further labs at that time.  Vag cultures UTD today and PAP UTD 04/2018.  Prior CS and risks discussed; low risk for miscarriage based on her history of CS 2016.  A total of 25 minutes were spent face-to-face with the patient during this encounter and over half of that time dealt with counseling and coordination of care.  Annamarie Major, MD, [facility] Frederick Ob/G[facility], [facility] 11/20/2018  10:43 AM

## 2018-11-21 ENCOUNTER — Other Ambulatory Visit: Payer: Self-pay | Admitting: Obstetrics & Gynecology

## 2018-11-21 LAB — CERVICOVAGINAL ANCILLARY ONLY
Bacterial vaginitis: POSITIVE — AB
Candida vaginitis: NEGATIVE
Chlamydia: NEGATIVE
Neisseria Gonorrhea: NEGATIVE
Trichomonas: NEGATIVE

## 2018-11-21 LAB — BETA HCG QUANT (REF LAB): hCG Quant: 582 m[IU]/mL

## 2018-11-21 MED ORDER — METRONIDAZOLE 0.75 % VA GEL: 1.0000 | Freq: Every day | VAGINAL | 0 refills | Status: AC

## 2018-11-27 ENCOUNTER — Encounter: Payer: Self-pay | Admitting: Maternal Newborn

## 2018-11-27 ENCOUNTER — Other Ambulatory Visit: Payer: Self-pay

## 2018-11-27 ENCOUNTER — Ambulatory Visit (INDEPENDENT_AMBULATORY_CARE_PROVIDER_SITE_OTHER): Payer: Medicaid Other | Admitting: Maternal Newborn

## 2018-11-27 DIAGNOSIS — Z3A01 Less than 8 weeks gestation of pregnancy: Secondary | ICD-10-CM

## 2018-11-27 DIAGNOSIS — Z3481 Encounter for supervision of other normal pregnancy, first trimester: Secondary | ICD-10-CM

## 2018-11-27 DIAGNOSIS — Z369 Encounter for antenatal screening, unspecified: Secondary | ICD-10-CM

## 2018-11-27 DIAGNOSIS — Z3480 Encounter for supervision of other normal pregnancy, unspecified trimester: Secondary | ICD-10-CM | POA: Insufficient documentation

## 2018-11-27 NOTE — Progress Notes (Signed)
NOB- had miscarriage in NOV 2019, just concerned and wants this pregnancy to be okay

## 2018-11-27 NOTE — Progress Notes (Signed)
Virtual Visit via Telephone Note  I connected with Tara Romero on 11/27/2018 at 1:32PM EDT by telephone and verified that I am speaking with the correct person using two identifiers.  Location: Patient: Home Provider: Office   I discussed the limitations, risks, security and privacy concerns of performing an evaluation and management service by telephone and the availability of in person appointments. The patient expressed understanding and agreed to proceed.  See note below for documentation of the content of this NOB telephone visit.   I discussed the assessment and treatment plan with the patient. The patient was provided an opportunity to ask questions and all were answered. The patient agreed with the plan and demonstrated an understanding of the instructions.   The patient was advised to call back or seek an in-person evaluation if the symptoms worsen or if the condition fails to improve as anticipated.  I provided 16 minutes of non-face-to-face time during this encounter.   Tara ConroyJacelyn Y Anea Romero, CNM  11/27/2018   Chief Complaint: Desires prenatal care.  Transfer of Care Patient: no  History of Present Illness: [name] is a 28 y.o. G3P1001 at 3034w4d based on Patient's last menstrual period on 10/19/2018 (exact date), with an Estimated Date of Delivery: 07/26/2019, with the above CC.   Her periods were: regular periods every 30 days. She has Positive signs or symptoms of nausea/vomiting of pregnancy. Only one episode of vomiting. Does not desire medication currently. She has Negative signs or symptoms of miscarriage or preterm labor She identifies Negative Zika risk factors for her and her partner On any different medications around the time she conceived/early pregnancy: No  History of varicella: No   Review of Systems  Constitutional: Negative.   HENT: Negative.   Eyes: Negative.   Respiratory: Negative.   Cardiovascular: Negative.   Gastrointestinal: Positive for nausea  and vomiting. Negative for abdominal pain.  Genitourinary: Negative.   Musculoskeletal: Negative.   Skin: Negative.   Neurological: Negative.   Endo/Heme/Allergies: Negative.   Psychiatric/Behavioral: Negative.    Review of systems was otherwise negative, except as stated in the above HPI.  OBGYN History: As per HPI. OB History  Gravida Para Term Preterm AB Living  3 1 1   1 1   SAB TAB Ectopic Multiple Live Births        0 1    # Outcome Date GA Lbr Len/2nd Weight Sex Delivery Anes PTL Lv  3 Current           2 AB 06/25/18     SAB     1 Term 04/08/15 5421w4d  6 lb 11.2 oz (3.039 kg) F CS-LTranv   LIV    Any issues with any prior pregnancies: Cesarean delivery with G1 for second stage arrest of labor. Miscarriage with D&E with G2 Any prior children are healthy, doing well, without any problems or issues: yes History of pap smears: Yes. Last pap smear: 05/15/2018.  NILM/HPV Negative History of STIs: No   Past Medical History: History reviewed. No pertinent past medical history.  Past Surgical History: Past Surgical History:  Procedure Laterality Date  . CESAREAN SECTION N/A 04/08/2015   Procedure: CESAREAN SECTION;  Surgeon: Vena AustriaAndreas Staebler, MD;  Location: [facility] ORS;  Service: Obstetrics;  Laterality: N/A;  . DILATION AND EVACUATION N/A 06/25/2018   Procedure: DILATATION AND EVACUATION;  Surgeon: Nadara MustardHarris, Robert P, MD;  Location: [facility] ORS;  Service: Gynecology;  Laterality: N/A;    Family History:  Negative/unremarkable except as detailed in  HPI. She denies any female cancers, bleeding or blood clotting disorders.  She denies any history of intellectual disability, birth defects or genetic disorders in her or the FOB's history  Social History:  Social History   Socioeconomic History  . Marital status: Single    Spouse name: Not on file  . Number of children: Not on file  . Years of education: Not on file  . Highest education level: Not on file  Occupational History  .  Not on file  Social Needs  . Financial resource strain: Not on file  . Food insecurity:    Worry: Not on file    Inability: Not on file  . Transportation needs:    Medical: Not on file    Non-medical: Not on file  Tobacco Use  . Smoking status: Never Smoker  . Smokeless tobacco: Never Used  Substance and Sexual Activity  . Alcohol use: No  . Drug use: No  . Sexual activity: Yes    Partners: Male    Birth control/protection: None  Lifestyle  . Physical activity:    Days per week: Not on file    Minutes per session: Not on file  . Stress: Not on file  Relationships  . Social connections:    Talks on phone: Not on file    Gets together: Not on file    Attends religious service: Not on file    Active member of club or organization: Not on file    Attends meetings of clubs or organizations: Not on file    Relationship status: Not on file  . Intimate partner violence:    Fear of current or ex partner: Not on file    Emotionally abused: Not on file    Physically abused: Not on file    Forced sexual activity: Not on file  Other Topics Concern  . Not on file  Social History Narrative  . Not on file   Any cats in the household: no, aware to avoid cat feces/litter  Domestic violence screening is negative.  Allergy: No Known Allergies  Current Outpatient Medications: No current outpatient medications on file.   Physical Exam:   LMP 10/19/2018 (Exact Date)  There is no height or weight on file to calculate BMI.  Physical exam was not done (telephone visit).  Assessment: [name] is a 28 y.o. G3P1001 at [redacted]w[redacted]d based on Patient's last menstrual period on 10/19/2018 (exact date), with an Estimated Date of Delivery: 07/26/19, presenting for prenatal care.  Plan:  1) Avoid alcoholic beverages. 2) Patient encouraged not to smoke.  3) Discontinue the use of all non-medicinal drugs and chemicals.  4) Take prenatal vitamins daily.  5) Seatbelt use advised 6) Nutrition, food  safety (fish, cheese advisories, and high nitrite foods) and exercise discussed. 7) Hospital and practice style delivering at [facility] discussed  8) Patient is asked about travel to areas at risk for the Zika virus, and counseled to avoid travel and exposure to mosquitoes or partners who may have themselves been exposed to the virus.   9) Genetic Screening, such as with 1st Trimester Screening, cell free fetal DNA, AFP testing, and Ultrasound, as well as with amniocentesis and CVS as appropriate, is discussed with patient. She plans to have genetic testing this pregnancy. 10) Ultrasound at 7 weeks for dating and viability, ordered today. 11) NOB labs with genetic screening at 10 weeks.  Problem list reviewed and updated.  Return in about 2 weeks (around 12/11/2018) for ROB/dating scan.  Marcelyn Bruins, CNM [facility], [facility]

## 2018-11-28 ENCOUNTER — Encounter: Payer: Self-pay | Admitting: Maternal Newborn

## 2018-11-28 NOTE — Patient Instructions (Signed)
First Trimester of Pregnancy  The first trimester of pregnancy is from week 1 until the end of week 13 (months 1 through 3). A week after a sperm fertilizes an egg, the egg will implant on the wall of the uterus. This embryo will begin to develop into a baby. Genes from you and your partner will form the baby. The female genes will determine whether the baby will be a boy or a girl. At 6-8 weeks, the eyes and face will be formed, and the heartbeat can be seen on ultrasound. At the end of 12 weeks, all the baby's organs will be formed.  Now that you are pregnant, you will want to do everything you can to have a healthy baby. Two of the most important things are to get good prenatal care and to follow your health care provider's instructions. Prenatal care is all the medical care you receive before the baby's birth. This care will help prevent, find, and treat any problems during the pregnancy and childbirth.  Body changes during your first trimester  Your body goes through many changes during pregnancy. The changes vary from woman to woman.   You may gain or lose a couple of pounds at first.   You may feel sick to your stomach (nauseous) and you may throw up (vomit). If the vomiting is uncontrollable, call your health care provider.   You may tire easily.   You may develop headaches that can be relieved by medicines. All medicines should be approved by your health care provider.   You may urinate more often. Painful urination may mean you have a bladder infection.   You may develop heartburn as a result of your pregnancy.   You may develop constipation because certain hormones are causing the muscles that push stool through your intestines to slow down.   You may develop hemorrhoids or swollen veins (varicose veins).   Your breasts may begin to grow larger and become tender. Your nipples may stick out more, and the tissue that surrounds them (areola) may become darker.   Your gums may bleed and may be  sensitive to brushing and flossing.   Dark spots or blotches (chloasma, mask of pregnancy) may develop on your face. This will likely fade after the baby is born.   Your menstrual periods will stop.   You may have a loss of appetite.   You may develop cravings for certain kinds of food.   You may have changes in your emotions from day to day, such as being excited to be pregnant or being concerned that something may go wrong with the pregnancy and baby.   You may have more vivid and strange dreams.   You may have changes in your hair. These can include thickening of your hair, rapid growth, and changes in texture. Some women also have hair loss during or after pregnancy, or hair that feels dry or thin. Your hair will most likely return to normal after your baby is born.  What to expect at prenatal visits  During a routine prenatal visit:   You will be weighed to make sure you and the baby are growing normally.   Your blood pressure will be taken.   Your abdomen will be measured to track your baby's growth.   The fetal heartbeat will be listened to between weeks 10 and 14 of your pregnancy.   Test results from any previous visits will be discussed.  Your health care provider may ask you:     How you are feeling.   If you are feeling the baby move.   If you have had any abnormal symptoms, such as leaking fluid, bleeding, severe headaches, or abdominal cramping.   If you are using any tobacco products, including cigarettes, chewing tobacco, and electronic cigarettes.   If you have any questions.  Other tests that may be performed during your first trimester include:   Blood tests to find your blood type and to check for the presence of any previous infections. The tests will also be used to check for low iron levels (anemia) and protein on red blood cells (Rh antibodies). Depending on your risk factors, or if you previously had diabetes during pregnancy, you may have tests to check for high blood sugar  that affects pregnant women (gestational diabetes).   Urine tests to check for infections, diabetes, or protein in the urine.   An ultrasound to confirm the proper growth and development of the baby.   Fetal screens for spinal cord problems (spina bifida) and Down syndrome.   HIV (human immunodeficiency virus) testing. Routine prenatal testing includes screening for HIV, unless you choose not to have this test.   You may need other tests to make sure you and the baby are doing well.  Follow these instructions at home:  Medicines   Follow your health care provider's instructions regarding medicine use. Specific medicines may be either safe or unsafe to take during pregnancy.   Take a prenatal vitamin that contains at least 600 micrograms (mcg) of folic acid.   If you develop constipation, try taking a stool softener if your health care provider approves.  Eating and drinking     Eat a balanced diet that includes fresh fruits and vegetables, whole grains, good sources of protein such as meat, eggs, or tofu, and low-fat dairy. Your health care provider will help you determine the amount of weight gain that is right for you.   Avoid raw meat and uncooked cheese. These carry germs that can cause birth defects in the baby.   Eating four or five small meals rather than three large meals a day may help relieve nausea and vomiting. If you start to feel nauseous, eating a few soda crackers can be helpful. Drinking liquids between meals, instead of during meals, also seems to help ease nausea and vomiting.   Limit foods that are high in fat and processed sugars, such as fried and sweet foods.   To prevent constipation:  ? Eat foods that are high in fiber, such as fresh fruits and vegetables, whole grains, and beans.  ? Drink enough fluid to keep your urine clear or pale yellow.  Activity   Exercise only as directed by your health care provider. Most women can continue their usual exercise routine during  pregnancy. Try to exercise for 30 minutes at least 5 days a week. Exercising will help you:  ? Control your weight.  ? Stay in shape.  ? Be prepared for labor and delivery.   Experiencing pain or cramping in the lower abdomen or lower back is a good sign that you should stop exercising. Check with your health care provider before continuing with normal exercises.   Try to avoid standing for long periods of time. Move your legs often if you must stand in one place for a long time.   Avoid heavy lifting.   Wear low-heeled shoes and practice good posture.   You may continue to have sex unless your health care   provider tells you not to.  Relieving pain and discomfort   Wear a good support bra to relieve breast tenderness.   Take warm sitz baths to soothe any pain or discomfort caused by hemorrhoids. Use hemorrhoid cream if your health care provider approves.   Rest with your legs elevated if you have leg cramps or low back pain.   If you develop varicose veins in your legs, wear support hose. Elevate your feet for 15 minutes, 3-4 times a day. Limit salt in your diet.  Prenatal care   Schedule your prenatal visits by the twelfth week of pregnancy. They are usually scheduled monthly at first, then more often in the last 2 months before delivery.   Write down your questions. Take them to your prenatal visits.   Keep all your prenatal visits as told by your health care provider. This is important.  Safety   Wear your seat belt at all times when driving.   Make a list of emergency phone numbers, including numbers for family, friends, the hospital, and police and fire departments.  General instructions   Ask your health care provider for a referral to a local prenatal education class. Begin classes no later than the beginning of month 6 of your pregnancy.   Ask for help if you have counseling or nutritional needs during pregnancy. Your health care provider can offer advice or refer you to specialists for help  with various needs.   Do not use hot tubs, steam rooms, or saunas.   Do not douche or use tampons or scented sanitary pads.   Do not cross your legs for long periods of time.   Avoid cat litter boxes and soil used by cats. These carry germs that can cause birth defects in the baby and possibly loss of the fetus by miscarriage or stillbirth.   Avoid all smoking, herbs, alcohol, and medicines not prescribed by your health care provider. Chemicals in these products affect the formation and growth of the baby.   Do not use any products that contain nicotine or tobacco, such as cigarettes and e-cigarettes. If you need help quitting, ask your health care provider. You may receive counseling support and other resources to help you quit.   Schedule a dentist appointment. At home, brush your teeth with a soft toothbrush and be gentle when you floss.  Contact a health care provider if:   You have dizziness.   You have mild pelvic cramps, pelvic pressure, or nagging pain in the abdominal area.   You have persistent nausea, vomiting, or diarrhea.   You have a bad smelling vaginal discharge.   You have pain when you urinate.   You notice increased swelling in your face, hands, legs, or ankles.   You are exposed to fifth disease or chickenpox.   You are exposed to German measles (rubella) and have never had it.  Get help right away if:   You have a fever.   You are leaking fluid from your vagina.   You have spotting or bleeding from your vagina.   You have severe abdominal cramping or pain.   You have rapid weight gain or loss.   You vomit blood or material that looks like coffee grounds.   You develop a severe headache.   You have shortness of breath.   You have any kind of trauma, such as from a fall or a car accident.  Summary   The first trimester of pregnancy is from week 1 until   the end of week 13 (months 1 through 3).   Your body goes through many changes during pregnancy. The changes vary from  woman to woman.   You will have routine prenatal visits. During those visits, your health care provider will examine you, discuss any test results you may have, and talk with you about how you are feeling.  This information is not intended to replace advice given to you by your health care provider. Make sure you discuss any questions you have with your health care provider.  Document Released: 07/04/2001 Document Revised: 06/21/2016 Document Reviewed: 06/21/2016  Elsevier Interactive Patient Education  2019 [employer]

## 2018-12-13 ENCOUNTER — Telehealth: Payer: Self-pay

## 2018-12-13 ENCOUNTER — Other Ambulatory Visit: Payer: Self-pay | Admitting: Maternal Newborn

## 2018-12-13 DIAGNOSIS — Z3480 Encounter for supervision of other normal pregnancy, unspecified trimester: Secondary | ICD-10-CM

## 2018-12-13 DIAGNOSIS — O219 Vomiting of pregnancy, unspecified: Secondary | ICD-10-CM

## 2018-12-13 MED ORDER — DOXYLAMINE-PYRIDOXINE 10-10 MG PO TBEC: 2.0000 | DELAYED_RELEASE_TABLET | Freq: Every day | ORAL | 5 refills | Status: DC

## 2018-12-13 NOTE — Telephone Encounter (Signed)
Pt calling for rx for nausea.  [phone]

## 2018-12-13 NOTE — Telephone Encounter (Signed)
Rx for Diclegis sent.

## 2018-12-13 NOTE — Telephone Encounter (Signed)
Pt aware.

## 2018-12-13 NOTE — Progress Notes (Signed)
Rx sent for Diclegis

## 2018-12-30 ENCOUNTER — Other Ambulatory Visit: Payer: Self-pay | Admitting: Maternal Newborn

## 2018-12-30 DIAGNOSIS — B9689 Other specified bacterial agents as the cause of diseases classified elsewhere: Secondary | ICD-10-CM

## 2018-12-30 MED ORDER — METRONIDAZOLE 0.75 % VA GEL: 1.0000 | Freq: Every day | VAGINAL | 0 refills | Status: AC

## 2018-12-30 NOTE — Progress Notes (Signed)
Recurrent BV symptoms. Rx sent for Metrogel at patient's request, first trimester of pregnancy.

## 2019-01-01 ENCOUNTER — Encounter: Payer: Medicaid Other | Admitting: Maternal Newborn

## 2019-01-01 ENCOUNTER — Other Ambulatory Visit: Payer: Medicaid Other

## 2019-02-17 ENCOUNTER — Telehealth: Payer: Self-pay | Admitting: Maternal Newborn

## 2019-02-17 NOTE — Telephone Encounter (Signed)
Called and left voicemail for patient to call back to be schedule ----- Message ----- From: [place] Lodge Sent: 02/16/2019   5:33 PM EDT To: Audree Camel Subject: Scheduling                                     Please contact the patient to see if she would like to reschedule. Thanks.

## 2019-05-01 ENCOUNTER — Inpatient Hospital Stay (HOSPITAL_COMMUNITY): Payer: Medicaid Other

## 2019-05-01 ENCOUNTER — Inpatient Hospital Stay (HOSPITAL_COMMUNITY)
Admission: AD | Admit: 2019-05-01 | Discharge: 2019-05-01 | Disposition: A | Discharge status: Home / Self Care | Payer: Medicaid Other | Attending: Obstetrics and Gynecology | Admitting: Obstetrics and Gynecology

## 2019-05-01 ENCOUNTER — Encounter (HOSPITAL_COMMUNITY): Payer: Self-pay | Admitting: *Deleted

## 2019-05-01 ENCOUNTER — Other Ambulatory Visit: Payer: Self-pay

## 2019-05-01 ENCOUNTER — Telehealth: Payer: Self-pay

## 2019-05-01 ENCOUNTER — Telehealth (INDEPENDENT_AMBULATORY_CARE_PROVIDER_SITE_OTHER): Payer: Medicaid Other

## 2019-05-01 DIAGNOSIS — O4690 Antepartum hemorrhage, unspecified, unspecified trimester: Secondary | ICD-10-CM | POA: Insufficient documentation

## 2019-05-01 DIAGNOSIS — Z3A01 Less than 8 weeks gestation of pregnancy: Secondary | ICD-10-CM

## 2019-05-01 DIAGNOSIS — O209 Hemorrhage in early pregnancy, unspecified: Secondary | ICD-10-CM

## 2019-05-01 DIAGNOSIS — O4691 Antepartum hemorrhage, unspecified, first trimester: Secondary | ICD-10-CM

## 2019-05-01 DIAGNOSIS — Z8759 Personal history of other complications of pregnancy, childbirth and the puerperium: Secondary | ICD-10-CM | POA: Diagnosis not present

## 2019-05-01 DIAGNOSIS — Z3200 Encounter for pregnancy test, result unknown: Secondary | ICD-10-CM

## 2019-05-01 LAB — URINALYSIS, ROUTINE W REFLEX MICROSCOPIC
Bilirubin Urine: NEGATIVE
Glucose, UA: NEGATIVE mg/dL
Ketones, ur: NEGATIVE mg/dL
Leukocytes,Ua: NEGATIVE
Nitrite: NEGATIVE
Protein, ur: NEGATIVE mg/dL
Specific Gravity, Urine: 1.002 — ABNORMAL LOW (ref 1.005–1.030)
pH: 7 (ref 5.0–8.0)

## 2019-05-01 LAB — CBC WITH DIFFERENTIAL/PLATELET
Abs Immature Granulocytes: 0.03 10*3/uL (ref 0.00–0.07)
Basophils Absolute: 0.1 10*3/uL (ref 0.0–0.1)
Basophils Relative: 1 %
Eosinophils Absolute: 0.5 10*3/uL (ref 0.0–0.5)
Eosinophils Relative: 5 %
HCT: 41.7 % (ref 36.0–46.0)
Hemoglobin: 14.7 g/dL (ref 12.0–15.0)
Immature Granulocytes: 0 %
Lymphocytes Relative: 23 %
Lymphs Abs: 2 10*3/uL (ref 0.7–4.0)
MCH: 30.4 pg (ref 26.0–34.0)
MCHC: 35.3 g/dL (ref 30.0–36.0)
MCV: 86.2 fL (ref 80.0–100.0)
Monocytes Absolute: 0.5 10*3/uL (ref 0.1–1.0)
Monocytes Relative: 6 %
Neutro Abs: 5.6 10*3/uL (ref 1.7–7.7)
Neutrophils Relative %: 65 %
Platelets: 200 10*3/uL (ref 150–400)
RBC: 4.84 MIL/uL (ref 3.87–5.11)
RDW: 11.6 % (ref 11.5–15.5)
WBC: 8.6 10*3/uL (ref 4.0–10.5)
nRBC: 0 % (ref 0.0–0.2)

## 2019-05-01 LAB — HCG, QUANTITATIVE, PREGNANCY: hCG, Beta Chain, Quant, S: 6338 m[IU]/mL — ABNORMAL HIGH (ref <5)

## 2019-05-01 LAB — POCT PREGNANCY, URINE: Preg Test, Ur: POSITIVE — AB

## 2019-05-01 NOTE — MAU Note (Signed)
Pt had positive HPT. Pt stated she started having some vag bleeding since last night.  Denies any  abd pain or cramping.

## 2019-05-01 NOTE — Telephone Encounter (Signed)
Monitor bleeding.  May be early pregnancy bleeding vs period (based on hormone results).  Can perform blood testing for pregnancy levels if persists.  OK to schedule if desired.

## 2019-05-01 NOTE — MAU Note (Addendum)
Bleeding since yesterday, steady, not that much. MD suggested she come in, 2 prior Miscarriage

## 2019-05-01 NOTE — Telephone Encounter (Signed)
Pt aware.  States her [place] is now in [place] and ask where to be seen.  Adv [facility], [facility].  Probably best to call around and see who is accepting Medicaid.

## 2019-05-01 NOTE — MAU Provider Note (Addendum)
History     CSN: [id number]  Arrival date and time: 05/01/19 1326   First Provider Initiated Contact with Patient 05/01/19 1558     Chief Complaint  Patient presents with  . Vaginal Bleeding   HPI Tara Romero is a 28 y.o. G4P1011 at [redacted]w[redacted]d by uncertain LMP who presents to MAU with chief complaint of vaginal spotting. This is a new problem, onset yesterday morning. Patient reports pink-tinged vaginal discharge all day yesterday and continuing today. She denies heavy vaginal bleeding, abdominal pain, abdominal tenderness, fever or recent illness. She is remote from sexual intercourse.   Patient's OB history is significant for miscarriage in November 2019 and August 2020.   OB History    Gravida  4   Para  1   Term  1   Preterm      AB  1   Living  1     SAB      TAB      Ectopic      Multiple  0   Live Births  1           History reviewed. No pertinent past medical history.  Past Surgical History:  Procedure Laterality Date  . CESAREAN SECTION N/A 04/08/2015   Procedure: CESAREAN SECTION;  Surgeon: Vena Austria, MD;  Location: [facility] ORS;  Service: Obstetrics;  Laterality: N/A;  . DILATION AND EVACUATION N/A 06/25/2018   Procedure: DILATATION AND EVACUATION;  Surgeon: Nadara Mustard, MD;  Location: [facility] ORS;  Service: Gynecology;  Laterality: N/A;    History reviewed. No pertinent family history.  Social History   Tobacco Use  . Smoking status: Never Smoker  . Smokeless tobacco: Never Used  Substance Use Topics  . Alcohol use: No  . Drug use: No    Allergies: No Known Allergies  Medications Prior to Admission  Medication Sig Dispense Refill Last Dose  . Doxylamine-Pyridoxine (DICLEGIS) 10-10 MG TBEC Take 2 tablets by mouth at bedtime. If symptoms persist, add one tablet in the morning and one in the afternoon 100 tablet 5     Review of Systems  Constitutional: Negative for chills, fatigue and fever.  Gastrointestinal: Negative for  abdominal pain.  Genitourinary: Positive for vaginal bleeding. Negative for difficulty urinating, dysuria and flank pain.  Musculoskeletal: Negative for back pain.  All other systems reviewed and are negative.  Physical Exam   Blood pressure 131/65, pulse 75, temperature 98.2 F (36.8 C), temperature source Oral, resp. rate 18, height 5\' 4"  (1.626 m), weight 67.1 kg, last menstrual period 03/25/2019, unknown if currently breastfeeding.  Physical Exam  Nursing note and vitals reviewed. Constitutional: She is oriented to person, place, and time. She appears well-developed and well-nourished.  Cardiovascular: Normal rate.  Respiratory: Effort normal and breath sounds normal. No respiratory distress.  GI: Soft. She exhibits no distension. There is no abdominal tenderness. There is no rebound, no guarding and no CVA tenderness.  Genitourinary:    Vaginal discharge present.     Genitourinary Comments: Thick white clusters of discharge on vaginal walls. No bleeding, no CMT   Neurological: She is alert and oriented to person, place, and time.  Skin: Skin is warm and dry.  Psychiatric: She has a normal mood and affect. Her behavior is normal. Judgment and thought content normal.    MAU Course/MDM  Procedures  Patient Vitals for the past 24 hrs:  BP Temp Temp src Pulse Resp Height Weight  05/01/19 1531 131/65 98.2 F (36.8 C) Oral  75 18 - -  05/01/19 1410 123/70 98.3 F (36.8 C) - 82 18 5\' 4"  (1.626 m) 67.1 kg   Results for orders placed or performed during the hospital encounter of 05/01/19 (from the past 24 hour(s))  Urinalysis, Routine w reflex microscopic     Status: Abnormal   Collection Time: 05/01/19  2:23 PM  Result Value Ref Range   Color, Urine STRAW (A) YELLOW   APPearance HAZY (A) CLEAR   Specific Gravity, Urine 1.002 (L) 1.005 - 1.030   pH 7.0 5.0 - 8.0   Glucose, UA NEGATIVE NEGATIVE mg/dL   Hgb urine dipstick LARGE (A) NEGATIVE   Bilirubin Urine NEGATIVE NEGATIVE    Ketones, ur NEGATIVE NEGATIVE mg/dL   Protein, ur NEGATIVE NEGATIVE mg/dL   Nitrite NEGATIVE NEGATIVE   Leukocytes,Ua NEGATIVE NEGATIVE   RBC / HPF 0-5 0 - 5 RBC/hpf   WBC, UA 0-5 0 - 5 WBC/hpf   Bacteria, UA RARE (A) NONE SEEN   Squamous Epithelial / LPF 6-10 0 - 5  Pregnancy, urine POC     Status: Abnormal   Collection Time: 05/01/19  2:23 PM  Result Value Ref Range   Preg Test, Ur POSITIVE (A) NEGATIVE  CBC with Differential/Platelet     Status: None   Collection Time: 05/01/19  4:00 PM  Result Value Ref Range   WBC 8.6 4.0 - 10.5 K/uL   RBC 4.84 3.87 - 5.11 MIL/uL   Hemoglobin 14.7 12.0 - 15.0 g/dL   HCT 16.141.7 09.636.0 - 04.546.0 %   MCV 86.2 80.0 - 100.0 fL   MCH 30.4 26.0 - 34.0 pg   MCHC 35.3 30.0 - 36.0 g/dL   RDW 40.911.6 81.111.5 - 91.415.5 %   Platelets 200 150 - 400 K/uL   nRBC 0.0 0.0 - 0.2 %   Neutrophils Relative % 65 %   Neutro Abs 5.6 1.7 - 7.7 K/uL   Lymphocytes Relative 23 %   Lymphs Abs 2.0 0.7 - 4.0 K/uL   Monocytes Relative 6 %   Monocytes Absolute 0.5 0.1 - 1.0 K/uL   Eosinophils Relative 5 %   Eosinophils Absolute 0.5 0.0 - 0.5 K/uL   Basophils Relative 1 %   Basophils Absolute 0.1 0.0 - 0.1 K/uL   Immature Granulocytes 0 %   Abs Immature Granulocytes 0.03 0.00 - 0.07 K/uL  hCG, quantitative, pregnancy     Status: Abnormal   Collection Time: 05/01/19  4:00 PM  Result Value Ref Range   hCG, Beta Chain, Quant, S 6,338 (H) <5 mIU/mL   [place] Ob Less Than 14 Weeks With Ob Transvaginal  Result Date: 05/01/2019 CLINICAL DATA:  Pregnant patient with vaginal bleeding. EXAM: OBSTETRIC <14 WK US AND TRANSVAGINAL OB US TECHNIQUE: Both transabdominal and transvaginal ultrasound examinations were performed for complete evaluation of the gestation as well as the maternal uterus, adnexal regions, and pelvic cul-de-sac. Transvaginal technique was performed to assess early pregnancy. COMPARISON:  None. FINDINGS: Intrauterine gestational sac: Single Yolk sac:  Not Visualized. Embryo:  Not  Visualized. Cardiac Activity: Not Visualized. MSD: 4.6 mm   5 w   1 d Subchorionic hemorrhage:  None visualized. Maternal uterus/adnexae: Right ovarian corpus luteum. Multiple nabothian cysts. Normal appearance of the left ovary. IMPRESSION: Probable early intrauterine gestational sac, but no yolk sac, fetal pole, or cardiac activity yet visualized. Recommend follow-up quantitative B-HCG levels and follow-up US in 14 days to assess viability. This recommendation follows SRU consensus guidelines: Diagnostic Criteria for Nonviable Pregnancy Early  in the First Trimester. [place] Med 2013; 103:1594-58. Electronically Signed   By: Lovey Newcomer M.D.   On: 05/01/2019 17:44   Assessment and Plan  --28 y.o. G4P1011 at [redacted]w[redacted]d by LMP --"probable intrauterine gestational sac" --Blood type AB POS --Discharge home in stable condition with bleeding/ectopic precautions  F/U: --Patient to return to MAU in 48 hours for repeat Quant hCG (Saturday 05/03/2019 after 4pm)  Darlina Rumpf, CNM 05/01/2019, 6:54 PM

## 2019-05-01 NOTE — Discharge Instructions (Signed)
Human Chorionic Gonadotropin Test °Why am I having this test? °A human chorionic gonadotropin (hCG) test is done to determine whether you are pregnant. It can also be used: °· To diagnose an abnormal pregnancy. °· To determine whether you have had a failed pregnancy (miscarriage) or are at risk of one. °What is being tested? °This test checks the level of the human chorionic gonadotropin (hCG) hormone in the blood. This hormone is produced during pregnancy by the cells that form the placenta. The placenta is the organ that grows inside your womb (uterus) to nourish a developing baby. When you are pregnant, hCG can be detected in your blood or urine 7 to 8 days before your missed period. It continues to go up for the first 8-10 weeks of pregnancy. °The presence of hCG in your blood can be measured with several different types of tests. You may have: °· A urine test. °? Because this hormone is eliminated from your body by your kidneys, you may have a urine test to find out whether you are pregnant. A home pregnancy test detects whether there is hCG in your urine. °? A urine test only shows whether there is hCG in your urine. It does not measure how much. °· A qualitative blood test. °? You may have this type of blood test to find out if you are pregnant. °? This blood test only shows whether there is hCG in your blood. It does not measure how much. °· A quantitative blood test. °? This type of blood test measures the amount of hCG in your blood. °? You may have this test to: °§ Diagnose an abnormal pregnancy. °§ Check whether you have had a miscarriage. °§ Determine whether you are at risk of a miscarriage. °What kind of sample is taken? ° °  ° °Two kinds of samples may be collected to test for the hCG hormone. °· Blood. It is usually collected by inserting a needle into a blood vessel. °· Urine. It is usually collected by urinating into a germ-free (sterile) specimen cup. It is best to collect the sample the first  time you urinate in the morning. °How do I prepare for this test? °No preparation is needed for a blood test.  °For the urine test: °· Let your health care provider know about: °? All medicines you are taking, including vitamins, herbs, creams, and over-the-counter medicines. °? Any blood in your urine. This may interfere with the result. °· Do not drink too much fluid. Drink as you normally would, or as directed by your health care provider. °How are the results reported? °Depending on the type of test that you have, your test results may be reported as values. Your health care provider will compare your results to normal ranges that were established after testing a large group of people (reference ranges). Reference ranges may vary among labs and hospitals. For this test, common reference ranges that show absence of pregnancy are: °· Quantitative hCG blood levels: less than 5 IU/L. °Other results will be reported as either positive or negative. For this test, normal results (meaning the absence of pregnancy) are: °· Negative for hCG in the urine test. °· Negative for hCG in the qualitative blood test. °What do the results mean? °Urine and qualitative blood test °· A negative result could mean: °? That you are not pregnant. °? That the test was done too early in your pregnancy to detect hCG in your blood or urine. If you still have other signs   of pregnancy, the test will be repeated. °· A positive result means: °? That you are most likely pregnant. Your health care provider may confirm your pregnancy with an imaging study (ultrasound) of your uterus, if needed. °Quantitative blood test °Results of the quantitative hCG blood test will be interpreted as follows: °· Less than 5 IU/L: You are most likely not pregnant. °· Greater than 25 IU/L: You are most likely pregnant. °· hCG levels that are higher than expected: °? You are pregnant with twins. °? You have abnormal growths in the uterus. °· hCG levels that are  rising more slowly than expected: °? You have an ectopic pregnancy (also called a tubal pregnancy). °· hCG levels that are falling: °? You may be having a miscarriage. °Talk with your health care provider about what your results mean. °Questions to ask your health care provider °Ask your health care provider, or the department that is doing the test: °· When will my results be ready? °· How will I get my results? °· What are my treatment options? °· What other tests do I need? °· What are my next steps? °Summary °· A human chorionic gonadotropin test is done to determine whether you are pregnant. °· When you are pregnant, hCG can be detected in your blood or urine 7 to 8 days before your missed period. It continues to go up for the first 8-10 weeks of pregnancy. °· Your hCG level can be measured with different types of tests. You may have a urine test, a qualitative blood test, or a quantitative blood test. °· Talk with your health care provider about what your results mean. °This information is not intended to replace advice given to you by your health care provider. Make sure you discuss any questions you have with your health care provider. °Document Released: 08/11/2004 Document Revised: 06/11/2017 Document Reviewed: 06/11/2017 °Elsevier Patient Education © 2020 Elsevier Inc. ° °

## 2019-05-01 NOTE — Telephone Encounter (Signed)
Pt left message on nurse VM requesting call back. Pt states she has a UPT nurse visit scheduled on Monday; states she is around [redacted] weeks pregnant and began bleeding last night.   Called pt. Pt reports steady, light pink bleeding since last night. 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 Instructed pt to go to the MAU for evaluation due to bleeding and her history of miscarriage. Pt verifies location of MAU and states she is leaving work now to go to MAU.

## 2019-05-01 NOTE — Telephone Encounter (Signed)
Pt calling; miscarried a month ago; is 3-4wks preg; several positive preg tests; last night started bleeding light pink, not heavy; it's time for period now.  Is this normal?  [phone]

## 2019-05-05 ENCOUNTER — Inpatient Hospital Stay (HOSPITAL_COMMUNITY)
Admission: AD | Admit: 2019-05-05 | Discharge: 2019-05-05 | Disposition: A | Discharge status: Home / Self Care | Payer: Medicaid Other | Attending: Obstetrics and Gynecology | Admitting: Obstetrics and Gynecology

## 2019-05-05 ENCOUNTER — Ambulatory Visit (INDEPENDENT_AMBULATORY_CARE_PROVIDER_SITE_OTHER): Payer: Medicaid Other

## 2019-05-05 ENCOUNTER — Other Ambulatory Visit: Payer: Self-pay

## 2019-05-05 DIAGNOSIS — Z3201 Encounter for pregnancy test, result positive: Secondary | ICD-10-CM | POA: Diagnosis not present

## 2019-05-05 DIAGNOSIS — M549 Dorsalgia, unspecified: Secondary | ICD-10-CM | POA: Diagnosis present

## 2019-05-05 DIAGNOSIS — Z3200 Encounter for pregnancy test, result unknown: Secondary | ICD-10-CM

## 2019-05-05 LAB — HCG, QUANTITATIVE, PREGNANCY: hCG, Beta Chain, Quant, S: 12166 m[IU]/mL — ABNORMAL HIGH (ref <5)

## 2019-05-05 LAB — POCT PREGNANCY, URINE: Preg Test, Ur: POSITIVE — AB

## 2019-05-05 NOTE — Progress Notes (Addendum)
Pt reports coming in today for STAT beta draw that she missed on Saturday due to her having to work.  I explained to the pt that we would not be able to draw a STAT beta tonight due to our office hours and that I would recommend that she go to MAU right now to get that STAT Beta lab drawn.  Pt states that she will be able to go after leaving the office to STAT beta drawn.  MAU nurse Philis Pique notified of pt's arrival.    Tara Romero 05/05/19  Chart reviewed for nurse visit. Agree with plan of care.   Virginia Rochester, NP 05/05/2019 8:06 PM

## 2019-05-05 NOTE — MAU Note (Signed)
Here for repeat quant labs.  Reports no additional bleeding since she was last seen or other complaints.

## 2019-05-05 NOTE — MAU Provider Note (Signed)
First Provider Initiated Contact with Patient 05/05/19 2018      S [name] is a 28 y.o. [phone] pregnant female who presents to MAU today for follow up quant. Patient with c/o back pain, but declines evaluation stating she may have over exerted herself at work.  Patient denies vaginal bleeding or abdominal pain.   O BP 121/72   Pulse 83   Temp 98.3 F (36.8 C)   Resp 17   LMP 03/25/2019   SpO2 100%  Physical Exam  Constitutional: She is oriented to person, place, and time. She appears well-developed and well-nourished. No distress.  HENT:  Head: Normocephalic and atraumatic.  Eyes: Conjunctivae are normal.  Neck: Normal range of motion.  Cardiovascular: Normal rate.  Respiratory: Effort normal.  Musculoskeletal: Normal range of motion.  Neurological: She is alert and oriented to person, place, and time.  Skin: Skin is warm and dry.  Psychiatric: She has a normal mood and affect. Her behavior is normal.    A Early Pregnant female Medical screening exam complete   P Discharge from MAU in stable condition. Will call with quant results. Warning signs for worsening condition that would warrant emergency follow-up discussed. Patient may return to MAU as needed for pregnancy related complaints.  Gavin Pound, CNM 05/05/2019 8:18 PM

## 2019-05-06 ENCOUNTER — Telehealth: Payer: Self-pay | Admitting: Student

## 2019-05-06 DIAGNOSIS — O3680X0 Pregnancy with inconclusive fetal viability, not applicable or unspecified: Secondary | ICD-10-CM

## 2019-05-06 NOTE — Telephone Encounter (Signed)
Patient returned phone call.  Patient called and verified her identity via birth date. Patient agreeable to results via phone and was informed of appropriate rise in HCG. Plans on going to the [place] office for prenatal care. Will order outpatient viability scan. Discussed reasons to return to MAU. Verbalized understanding.     Jorje Guild, NP

## 2019-05-06 NOTE — Telephone Encounter (Signed)
Left voicemail for patient to return call.  Need to discuss HCG results & follow up.   Jorje Guild, NP

## 2019-05-13 ENCOUNTER — Telehealth: Payer: Self-pay | Admitting: Family Medicine

## 2019-05-13 ENCOUNTER — Telehealth: Payer: Self-pay | Admitting: Advanced Practice Midwife

## 2019-05-13 NOTE — Telephone Encounter (Signed)
Attempted to call patient w/ her ultrasound appointment ( 10/28 @ 8). No answer left voicemail instructing patient to arrive at 7:45. Patient instructed to give the office a call back if she is needing to reschedule.

## 2019-05-13 NOTE — Telephone Encounter (Signed)
Pt called MAU because she reports not getting a call from [facility] to schedule [place] for PUL. No pain or bleeding today. In-basket message sent to [facility] to have it scheduled. Ectopic precautions.

## 2019-05-21 ENCOUNTER — Other Ambulatory Visit: Payer: Self-pay

## 2019-05-21 ENCOUNTER — Ambulatory Visit (INDEPENDENT_AMBULATORY_CARE_PROVIDER_SITE_OTHER): Payer: Medicaid Other

## 2019-05-21 ENCOUNTER — Encounter: Payer: Self-pay | Admitting: Family Medicine

## 2019-05-21 ENCOUNTER — Ambulatory Visit (HOSPITAL_COMMUNITY)
Admission: RE | Admit: 2019-05-21 | Discharge: 2019-05-21 | Disposition: A | Discharge status: Home / Self Care | Payer: Medicaid Other | Source: Ambulatory Visit | Attending: Student | Admitting: Student

## 2019-05-21 DIAGNOSIS — O3680X0 Pregnancy with inconclusive fetal viability, not applicable or unspecified: Secondary | ICD-10-CM | POA: Diagnosis not present

## 2019-05-21 DIAGNOSIS — N898 Other specified noninflammatory disorders of vagina: Secondary | ICD-10-CM

## 2019-05-21 DIAGNOSIS — Z3480 Encounter for supervision of other normal pregnancy, unspecified trimester: Secondary | ICD-10-CM

## 2019-05-21 MED ORDER — PRENATAL PLUS 27-1 MG PO TABS: 1.0000 | ORAL_TABLET | Freq: Every day | ORAL | 0 refills | Status: DC

## 2019-05-21 MED ORDER — TERCONAZOLE 0.4 % VA CREA: 1.0000 | TOPICAL_CREAM | Freq: Every day | VAGINAL | 0 refills | Status: DC

## 2019-05-21 NOTE — Progress Notes (Signed)
Pt to office following OB US for pregnancy of unknown location. Results reviewed by Rip Harbour, MD, who finds single, living IUP. Recommends pt begin prenatal vitamin and prenatal care.   Reviewed results and provider recommendation with patient. Reviewed medications and allergies with pt; list of meds safe to take during pregnancy given. Prenatal rx sent to pt's preferred pharmacy. Pt reports symptoms of yeast infection. Reports using Monistat otc, which cleared up thick, white discharge; pt is still experiencing itching. Denies foul odor. Terazole 7 rx sent to pharmacy.   [place] office to provide proof of pregnancy letter and schedule prenatal appts. Pt has no other concerns at this time.  Apolonio Schneiders RN 05/21/19

## 2019-05-21 NOTE — Progress Notes (Signed)
Agree with A & P. 

## 2019-05-29 ENCOUNTER — Inpatient Hospital Stay (HOSPITAL_COMMUNITY): Payer: Medicaid Other

## 2019-05-29 ENCOUNTER — Inpatient Hospital Stay (HOSPITAL_COMMUNITY)
Admission: AD | Admit: 2019-05-29 | Discharge: 2019-05-29 | Disposition: A | Discharge status: Home / Self Care | Payer: Medicaid Other | Source: Ambulatory Visit | Attending: Obstetrics & Gynecology | Admitting: Obstetrics & Gynecology

## 2019-05-29 ENCOUNTER — Encounter (HOSPITAL_COMMUNITY): Payer: Self-pay

## 2019-05-29 ENCOUNTER — Other Ambulatory Visit: Payer: Self-pay

## 2019-05-29 DIAGNOSIS — O208 Other hemorrhage in early pregnancy: Secondary | ICD-10-CM | POA: Insufficient documentation

## 2019-05-29 DIAGNOSIS — Z79899 Other long term (current) drug therapy: Secondary | ICD-10-CM | POA: Diagnosis not present

## 2019-05-29 DIAGNOSIS — O418X10 Other specified disorders of amniotic fluid and membranes, first trimester, not applicable or unspecified: Secondary | ICD-10-CM

## 2019-05-29 DIAGNOSIS — Z3A09 9 weeks gestation of pregnancy: Secondary | ICD-10-CM | POA: Diagnosis not present

## 2019-05-29 DIAGNOSIS — O209 Hemorrhage in early pregnancy, unspecified: Secondary | ICD-10-CM | POA: Diagnosis not present

## 2019-05-29 DIAGNOSIS — O34219 Maternal care for unspecified type scar from previous cesarean delivery: Secondary | ICD-10-CM | POA: Insufficient documentation

## 2019-05-29 DIAGNOSIS — O468X1 Other antepartum hemorrhage, first trimester: Secondary | ICD-10-CM

## 2019-05-29 NOTE — Discharge Instructions (Signed)
Vaginal Bleeding During Pregnancy, First Trimester  A small amount of bleeding from the vagina (spotting) is relatively common during early pregnancy. It usually stops on its own. Various things may cause bleeding or spotting during early pregnancy. Some bleeding may be related to the pregnancy, and some may not. In many cases, the bleeding is normal and is not a problem. However, bleeding can also be a sign of something serious. Be sure to tell your health care provider about any vaginal bleeding right away. Some possible causes of vaginal bleeding during the first trimester include:  Infection or inflammation of the cervix.  Growths (polyps) on the cervix.  Miscarriage or threatened miscarriage.  Pregnancy tissue developing outside of the uterus (ectopic pregnancy).  A mass of tissue developing in the uterus due to an egg being fertilized incorrectly (molar pregnancy). Follow these instructions at home: Activity  Follow instructions from your health care provider about limiting your activity. Ask what activities are safe for you.  If needed, make plans for someone to help with your regular activities.  Do not have sex or orgasms until your health care provider says that this is safe. General instructions  Take over-the-counter and prescription medicines only as told by your health care provider.  Pay attention to any changes in your symptoms.  Do not use tampons or douche.  Write down how many pads you use each day, how often you change pads, and how soaked (saturated) they are.  If you pass any tissue from your vagina, save the tissue so you can show it to your health care provider.  Keep all follow-up visits as told by your health care provider. This is important. Contact a health care provider if:  You have vaginal bleeding during any part of your pregnancy.  You have cramps or labor pains.  You have a fever. Get help right away if:  You have severe cramps in your  back or abdomen.  You pass large clots or a large amount of tissue from your vagina.  Your bleeding increases.  You feel light-headed or weak, or you faint.  You have chills.  You are leaking fluid or have a gush of fluid from your vagina. Summary  A small amount of bleeding (spotting) from the vagina is relatively common during early pregnancy.  Various things may cause bleeding or spotting in early pregnancy.  Be sure to tell your health care provider about any vaginal bleeding right away. This information is not intended to replace advice given to you by your health care provider. Make sure you discuss any questions you have with your health care provider. Document Released: 04/19/2005 Document Revised: 10/29/2018 Document Reviewed: 10/12/2016 Elsevier Patient Education  2020 [place] Hematoma  A subchorionic hematoma is a gathering of blood between the outer wall of the embryo (chorion) and the inner wall of the womb (uterus). This condition can cause vaginal bleeding. If they cause little or no vaginal bleeding, early small hematomas usually shrink on their own and do not affect your baby or pregnancy. When bleeding starts later in pregnancy, or if the hematoma is larger or occurs in older pregnant women, the condition may be more serious. Larger hematomas may get bigger, which increases the chances of miscarriage. This condition also increases the risk of:  Premature separation of the placenta from the uterus.  Premature (preterm) labor.  Stillbirth. What are the causes? The exact cause of this condition is not known. It occurs when blood is  trapped between the placenta and the uterine wall because the placenta has separated from the original site of implantation. What increases the risk? You are more likely to develop this condition if:  You were treated with fertility medicines.  You conceived through in vitro fertilization (IVF). What are the  signs or symptoms? Symptoms of this condition include:  Vaginal spotting or bleeding.  Contractions of the uterus. These cause abdominal pain. Sometimes you may have no symptoms and the bleeding may only be seen when ultrasound images are taken (transvaginal ultrasound). How is this diagnosed? This condition is diagnosed based on a physical exam. This includes a pelvic exam. You may also have other tests, including:  Blood tests.  Urine tests.  Ultrasound of the abdomen. How is this treated? Treatment for this condition can vary. Treatment may include:  Watchful waiting. You will be monitored closely for any changes in bleeding. During this stage: ? The hematoma may be reabsorbed by the body. ? The hematoma may separate the fluid-filled space containing the embryo (gestational sac) from the wall of the womb (endometrium).  Medicines.  Activity restriction. This may be needed until the bleeding stops. Follow these instructions at home:  Stay on bed rest if told to do so by your health care provider.  Do not lift anything that is heavier than 10 lbs. (4.5 kg) or as told by your health care provider.  Do not use any products that contain nicotine or tobacco, such as cigarettes and e-cigarettes. If you need help quitting, ask your health care provider.  Track and write down the number of pads you use each day and how soaked (saturated) they are.  Do not use tampons.  Keep all follow-up visits as told by your health care provider. This is important. Your health care provider may ask you to have follow-up blood tests or ultrasound tests or both. Contact a health care provider if:  You have any vaginal bleeding.  You have a fever. Get help right away if:  You have severe cramps in your stomach, back, abdomen, or pelvis.  You pass large clots or tissue. Save any tissue for your health care provider to look at.  You have more vaginal bleeding, and you faint or become  lightheaded or weak. Summary  A subchorionic hematoma is a gathering of blood between the outer wall of the placenta and the uterus.  This condition can cause vaginal bleeding.  Sometimes you may have no symptoms and the bleeding may only be seen when ultrasound images are taken.  Treatment may include watchful waiting, medicines, or activity restriction. This information is not intended to replace advice given to you by your health care provider. Make sure you discuss any questions you have with your health care provider. Document Released: 10/25/2006 Document Revised: 06/22/2017 Document Reviewed: 09/05/2016 Elsevier Patient Education  2020 [employer].

## 2019-05-29 NOTE — MAU Note (Signed)
Vag bleeding started about an hour ago. No pain. Had intercourse before bleeding started.

## 2019-05-29 NOTE — MAU Provider Note (Signed)
Chief Complaint: Vaginal Bleeding   First Provider Initiated Contact with Patient 05/29/19 0238        SUBJECTIVE HPI: Tara Romero is a 28 y.o. G4P1011 at [redacted]w[redacted]d by LMP who presents to maternity admissions reporting vaginal bleeding after intercourse.  States it was a large amount.  Still bleeding now. . She denies vaginal itching/burning, urinary symptoms, h/a, dizziness, n/v, or fever/chills.    Vaginal Bleeding The patient's primary symptoms include vaginal bleeding. The patient's pertinent negatives include no genital itching, genital lesions, genital odor or pelvic pain. This is a new problem. The current episode started today. The problem occurs constantly. The problem has been gradually improving. The patient is experiencing no pain. She is pregnant. Pertinent negatives include no abdominal pain, chills, constipation, diarrhea, dysuria, fever, nausea or vomiting. The vaginal discharge was bloody. The vaginal bleeding is heavier than menses. She has not been passing clots. She has not been passing tissue. Nothing aggravates the symptoms. She has tried nothing for the symptoms.    RN Note: Vag bleeding started about an hour ago. No pain. Had intercourse before bleeding started.  History reviewed. No pertinent past medical history. Past Surgical History:  Procedure Laterality Date  . CESAREAN SECTION N/A 04/08/2015   Procedure: CESAREAN SECTION;  Surgeon: Malachy Mood, MD;  Location: [facility] ORS;  Service: Obstetrics;  Laterality: N/A;  . DILATION AND EVACUATION N/A 06/25/2018   Procedure: DILATATION AND EVACUATION;  Surgeon: Gae Dry, MD;  Location: [facility] ORS;  Service: Gynecology;  Laterality: N/A;   Social History   Socioeconomic History  . Marital status: Single    Spouse name: Not on file  . Number of children: Not on file  . Years of education: Not on file  . Highest education level: Not on file  Occupational History  . Not on file  Social Needs  . Financial  resource strain: Not on file  . Food insecurity    Worry: Not on file    Inability: Not on file  . Transportation needs    Medical: Not on file    Non-medical: Not on file  Tobacco Use  . Smoking status: Never Smoker  . Smokeless tobacco: Never Used  Substance and Sexual Activity  . Alcohol use: No  . Drug use: No  . Sexual activity: Yes    Partners: Male    Birth control/protection: None  Lifestyle  . Physical activity    Days per week: Not on file    Minutes per session: Not on file  . Stress: Not on file  Relationships  . Social Herbalist on phone: Not on file    Gets together: Not on file    Attends religious service: Not on file    Active member of club or organization: Not on file    Attends meetings of clubs or organizations: Not on file    Relationship status: Not on file  . Intimate partner violence    Fear of current or ex partner: Not on file    Emotionally abused: Not on file    Physically abused: Not on file    Forced sexual activity: Not on file  Other Topics Concern  . Not on file  Social History Narrative  . Not on file   No current facility-administered medications on file prior to encounter.    Current Outpatient Medications on File Prior to Encounter  Medication Sig Dispense Refill  . Doxylamine-Pyridoxine (DICLEGIS) 10-10 MG TBEC Take 2 tablets  by mouth at bedtime. If symptoms persist, add one tablet in the morning and one in the afternoon 100 tablet 5  . prenatal vitamin w/FE, FA (PRENATAL 1 + 1) 27-1 MG TABS tablet Take 1 tablet by mouth daily at 12 noon. 30 tablet 0  . terconazole (TERAZOL 7) 0.4 % vaginal cream Place 1 applicator vaginally at bedtime. 45 g 0   No Known Allergies  I have reviewed patient's Past Medical Hx, Surgical Hx, Family Hx, Social Hx, medications and allergies.   ROS:  Review of Systems  Constitutional: Negative for chills and fever.  Respiratory: Negative for shortness of breath.   Gastrointestinal:  Negative for abdominal pain, constipation, diarrhea, nausea and vomiting.  Genitourinary: Positive for vaginal bleeding. Negative for dysuria and pelvic pain.  Neurological: Negative for dizziness.   Review of Systems  Other systems negative   Physical Exam  Physical Exam Patient Vitals for the past 24 hrs:  BP Temp Pulse Resp Height Weight  05/29/19 0210 123/68 - 82 - - -  05/29/19 0209 - 98.4 F (36.9 C) - 18 5\' 4"  (1.626 m) 68.5 kg   Constitutional: Well-developed, well-nourished female in no acute distress.  Cardiovascular: normal rate Respiratory: normal effort GI: Abd soft, non-tender. Pos BS x 4 MS: Extremities nontender, no edema, normal ROM Neurologic: Alert and oriented x 4.  GU: Neg CVAT.  PELVIC EXAM:   Speculum exam with moderate pooling of blood, cervix closed.   Unable to auscultate FHT with doppler   LAB RESULTS No results found for this or any previous visit (from the past 24 hour(s)).   --/--/AB POS Performed at [facility], [address] [place], [place]  [phone])  IMAGING 14/03 Ob Transvaginal  Result Date: 05/29/2019 CLINICAL DATA:  Initial evaluation for acute vaginal bleeding, early pregnancy. EXAM: TRANSVAGINAL OB ULTRASOUND TECHNIQUE: Transvaginal ultrasound was performed for complete evaluation of the gestation as well as the maternal uterus, adnexal regions, and pelvic cul-de-sac. COMPARISON:  Prior ultrasound from 05/21/2019. FINDINGS: Intrauterine gestational sac: Single Yolk sac:  Present Embryo:  Present Cardiac Activity: Present Heart Rate: 180 bpm CRL: 24.6 mm   9 w 1 d                  05/23/2019 EDC: 12/31/2019 Subchorionic hemorrhage: Small subchorionic hemorrhage measuring 3.0 x 0.8 x 0.6 cm without associated mass effect. Maternal uterus/adnexae: Ovaries are normal in appearance bilaterally. Small corpus luteal cyst noted on the right. No adnexal mass or free fluid. IMPRESSION: 1. Single viable intrauterine pregnancy,  estimated gestational age [redacted] weeks and 1 day by crown-rump length, with ultrasound EDC of 12/31/2019. 2. Small subchorionic hemorrhage without associated mass effect. 3. No other acute maternal uterine or adnexal abnormality identified. Electronically Signed   By: 03/01/2020 M.D.   On: 05/29/2019 03:25     MAU Management/MDM: Ordered 13/11/2018 for assessment of viability and source of bleeding. US done last week did not show a subchorionic hemorrhage Discussed she now has a small [facility] which is source of blood May continue to have bleeding for several weeks Recommend pelvic rest x 1 week  ASSESSMENT SIUP at [redacted]w[redacted]d Bleeding in first trimester Small subchorionic hemorrhage  PLAN Discharge home BLeeding precautions Pelvic rest Pt stable at time of discharge. Encouraged to return here or to other Urgent Care/ED if she develops worsening of symptoms, increase in pain, fever, or other concerning symptoms.    [redacted]w[redacted]d CNM, MSN Certified Nurse-Midwife 05/29/2019  2:38  AM    

## 2019-05-30 ENCOUNTER — Telehealth: Payer: Self-pay | Admitting: Lactation Services

## 2019-05-30 NOTE — Telephone Encounter (Signed)
Pt called this morning in regards to her appt on Monday.   Pt reports she went to the MAU a few days with vaginal bleeding. Vaginal bleeding has stopped since that day. She has to pain or bleeding today. Pt wants to know if she needs to be seen earlier in the office.   Pt informed to keep her Nurse intake and to go the MAU For increasing pain or bleeding for evaluation.

## 2019-06-02 ENCOUNTER — Encounter: Payer: Self-pay | Admitting: *Deleted

## 2019-06-02 ENCOUNTER — Other Ambulatory Visit: Payer: Self-pay

## 2019-06-02 ENCOUNTER — Ambulatory Visit (INDEPENDENT_AMBULATORY_CARE_PROVIDER_SITE_OTHER): Payer: Medicaid Other | Admitting: *Deleted

## 2019-06-02 DIAGNOSIS — Z8759 Personal history of other complications of pregnancy, childbirth and the puerperium: Secondary | ICD-10-CM

## 2019-06-02 DIAGNOSIS — O210 Mild hyperemesis gravidarum: Secondary | ICD-10-CM

## 2019-06-02 DIAGNOSIS — O0990 Supervision of high risk pregnancy, unspecified, unspecified trimester: Secondary | ICD-10-CM | POA: Insufficient documentation

## 2019-06-02 DIAGNOSIS — Z3480 Encounter for supervision of other normal pregnancy, unspecified trimester: Secondary | ICD-10-CM

## 2019-06-02 DIAGNOSIS — O219 Vomiting of pregnancy, unspecified: Secondary | ICD-10-CM

## 2019-06-02 MED ORDER — DOXYLAMINE-PYRIDOXINE 10-10 MG PO TBEC: 2.0000 | DELAYED_RELEASE_TABLET | Freq: Every day | ORAL | 5 refills | Status: DC

## 2019-06-02 MED ORDER — BLOOD PRESSURE KIT DEVI: 1.0000 | 0 refills | Status: DC | PRN

## 2019-06-02 NOTE — Addendum Note (Signed)
Addended by: Samuel Germany on: 06/02/2019 04:35 PM   Modules accepted: Orders, Level of Service

## 2019-06-02 NOTE — Patient Instructions (Signed)

## 2019-06-02 NOTE — Progress Notes (Signed)
I connected with  BLAINE GUIFFRE on 06/02/19 at  3:30 PM EST by telephone and verified that I am speaking with the correct person using two identifiers.   I discussed the limitations, risks, security and privacy concerns of performing an evaluation and management service by telephone and the availability of in person appointments. I also discussed with the patient that there may be a patient responsible charge related to this service. The patient expressed understanding and agreed to proceed. Explained I am completing her New OB Intake today. We discussed Her EDD and that it is based on  sure LMP . I reviewed her allergies, meds, OB History, Medical /Surgical history, and appropriate screenings. I explained I will send her the Babyscripts app- app sent to her while on phone.  I explained we will send a blood pressure cuff to [facility] that will fill that prescription and they  will call her to verify her information. I asked her to bring the blood pressure cuff with her to her first ob appointment so we can show her how to use it. Explained  then we will have her take her blood pressure weekly and enter into the app. Explained she will have some visits in office and some virtually. She already has Community education officer. Reviewed appointment date/ time with her , our location and to wear mask, no visitors. Explained she will have exam, ob bloodwork, hemoglobin a1C, cbg , genetic testing if desired, pap if needed. I scheduled an [place] at 19 weeks and gave her the appointment. She voices understanding.   Chrishawna Farina,RN 06/02/2019  4:00 PM

## 2019-06-02 NOTE — Progress Notes (Signed)
3:37pm I called Cathern and left a message I am calling for your telephone visit and will call again in a few minutes ; please be available by phone. Linda,RN 3:41 I called Shequila and left  A message I am calling for your telephone visit and since we did not reach you; you will need to call our office to reschedule. I will also call your contact number.  I called her contact number Abigail Butts) and left a message we are trying to reach [facility] and you are listed as a contact number- please give her message to call us about her appointment. Linda,RN

## 2019-06-04 NOTE — Progress Notes (Signed)
Chart reviewed for nurse visit. Agree with plan of care.   Sahmya Arai Lorraine, CNM 06/04/2019 3:21 PM   

## 2019-06-16 ENCOUNTER — Encounter: Payer: Medicaid Other | Admitting: Medical

## 2019-06-25 ENCOUNTER — Other Ambulatory Visit: Payer: Self-pay

## 2019-06-25 ENCOUNTER — Ambulatory Visit (INDEPENDENT_AMBULATORY_CARE_PROVIDER_SITE_OTHER): Payer: Medicaid Other | Admitting: Advanced Practice Midwife

## 2019-06-25 ENCOUNTER — Encounter: Payer: Self-pay | Admitting: Advanced Practice Midwife

## 2019-06-25 ENCOUNTER — Other Ambulatory Visit (HOSPITAL_COMMUNITY)
Admission: RE | Admit: 2019-06-25 | Discharge: 2019-06-25 | Disposition: A | Discharge status: Home / Self Care | Payer: Medicaid Other | Source: Ambulatory Visit | Attending: Advanced Practice Midwife | Admitting: Advanced Practice Midwife

## 2019-06-25 VITALS — BP 113/78 | HR 69 | Wt 148.9 lb

## 2019-06-25 DIAGNOSIS — O0990 Supervision of high risk pregnancy, unspecified, unspecified trimester: Secondary | ICD-10-CM | POA: Insufficient documentation

## 2019-06-25 DIAGNOSIS — Z3A13 13 weeks gestation of pregnancy: Secondary | ICD-10-CM

## 2019-06-25 DIAGNOSIS — Z8759 Personal history of other complications of pregnancy, childbirth and the puerperium: Secondary | ICD-10-CM

## 2019-06-25 DIAGNOSIS — O0991 Supervision of high risk pregnancy, unspecified, first trimester: Secondary | ICD-10-CM | POA: Diagnosis not present

## 2019-06-25 NOTE — Progress Notes (Signed)
Subjective:   Tara Romero is a 28 y.o. [id number] at 47w1dby LMP, early ultrasound being seen today for her first obstetrical visit.  Her obstetrical history is significant for pregnancy induced hypertension. Patient does intend to breast feed. Pregnancy history fully reviewed.  Patient reports no complaints.  HISTORY: OB History  Gravida Para Term Preterm AB Living  _0 0 2 1  SAB TAB Ectopic Multiple Live Births  1 0 0 0 1    # Outcome Date GA Lbr Len/2nd Weight Sex Delivery Anes PTL Lv  4 Current           3 SAB 01/2019             Birth Comments: miscarriage per patient; not confirmed in doctors office or hospital  2 AB 06/25/18     SAB        Birth Comments: had D&c  1 Term 04/08/15 447w4d6 lb 11.2 oz (3.039 kg) F CS-LTranv   LIV     Birth Comments: c/s ftp;     Name: SAOREE, MIRELEZ   Apgar1: 1  Apgar5: 6    Last pap smear was done 04/2018 and was normal  History reviewed. No pertinent past medical history. Past Surgical History:  Procedure Laterality Date  . CESAREAN SECTION N/A 04/08/2015   Procedure: CESAREAN SECTION;  Surgeon: AnMalachy MoodMD;  Location: [facility] ORS;  Service: Obstetrics;  Laterality: N/A;  . DILATION AND EVACUATION N/A 06/25/2018   Procedure: DILATATION AND EVACUATION;  Surgeon: HaGae DryMD;  Location: [facility] ORS;  Service: Gynecology;  Laterality: N/A;   History reviewed. No pertinent family history. Social History   Tobacco Use  . Smoking status: Never Smoker  . Smokeless tobacco: Never Used  Substance Use Topics  . Alcohol use: No  . Drug use: No   No Known Allergies Current Outpatient Medications on File Prior to Visit  Medication Sig Dispense Refill  . Blood Pressure Monitoring (BLOOD PRESSURE KIT) DEVI 1 Device by Does not apply route as needed. 1 Device 0  . prenatal vitamin w/FE, FA (PRENATAL 1 + 1) 27-1 MG TABS tablet Take 1 tablet by mouth daily at 12 noon. 30 tablet 0  . Doxylamine-Pyridoxine (DICLEGIS) 10-10  MG TBEC Take 2 tablets by mouth at bedtime. If symptoms persist, add one tablet in the morning and one in the afternoon (Patient not taking: Reported on 06/25/2019) 100 tablet 5  . terconazole (TERAZOL 7) 0.4 % vaginal cream Place 1 applicator vaginally at bedtime. (Patient not taking: Reported on 06/25/2019) 45 g 0   No current facility-administered medications on file prior to visit.     Review of Systems Pertinent items noted in HPI and remainder of comprehensive ROS otherwise negative.  Exam   Vitals:   06/25/19 0847  BP: 113/78  Pulse: 69  Weight: 148 lb 14.4 oz (67.5 kg)   Fetal Heart Rate (bpm): 158  Physical Exam  Constitutional: She is well-developed, well-nourished, and in no distress. No distress.  HENT:  Head: Normocephalic.  Cardiovascular: Normal rate.  Pulmonary/Chest: Effort normal.  Genitourinary:    Vagina normal.     No vaginal discharge.   Musculoskeletal: Normal range of motion.  Neurological: She is alert.  Skin: Skin is warm and dry.  Psychiatric: Affect normal.  Nursing note and vitals reviewed.   Assessment:   Pregnancy: [id number] Active Problem List   Diagnosis Date Noted  . Supervision of high risk pregnancy,  antepartum 06/02/2019  . Missed ab 05/29/2018  . History of gestational hypertension 05/15/2018     Plan:  1. Supervision of high risk pregnancy, antepartum - routine care - Genetic Screening - Obstetric Panel, Including HIV - Culture, OB Urine - Hemoglobin A1c - AFP, Serum, Open Spina Bifida; Future - GC/CT, cervicovaginal ancillary   2. History of Gestational Hypertension - baseline labs at [facility] - Start baby ASA   Initial labs drawn. Continue prenatal vitamins. Genetic Screening discussed, horizon and AFP and NIPS: ordered. Ultrasound discussed; fetal anatomic survey: ordered. Problem list reviewed and updated. The nature of [place] with multiple MDs and other Advanced  Practice Providers was explained to patient; also emphasized that residents, students are part of our team. Routine obstetric precautions reviewed. 50% of 45 min visit spent in counseling and coordination of care. Return in about 4 weeks (around 07/23/2019) for virtual visit .   Marcille Buffy DNP, CNM  06/25/19  9:36 AM

## 2019-06-26 ENCOUNTER — Encounter: Payer: Self-pay | Admitting: *Deleted

## 2019-06-26 LAB — OBSTETRIC PANEL, INCLUDING HIV
Antibody Screen: NEGATIVE
Basophils Absolute: 0 10*3/uL (ref 0.0–0.2)
Basos: 0 %
EOS (ABSOLUTE): 0.3 10*3/uL (ref 0.0–0.4)
Eos: 3 %
HIV Screen 4th Generation wRfx: NONREACTIVE
Hematocrit: 39.4 % (ref 34.0–46.6)
Hemoglobin: 13.8 g/dL (ref 11.1–15.9)
Hepatitis B Surface Ag: NEGATIVE
Immature Grans (Abs): 0 10*3/uL (ref 0.0–0.1)
Immature Granulocytes: 0 %
Lymphocytes Absolute: 1.4 10*3/uL (ref 0.7–3.1)
Lymphs: 14 %
MCH: 31.1 pg (ref 26.6–33.0)
MCHC: 35 g/dL (ref 31.5–35.7)
MCV: 89 fL (ref 79–97)
Monocytes Absolute: 0.5 10*3/uL (ref 0.1–0.9)
Monocytes: 5 %
Neutrophils Absolute: 8 10*3/uL — ABNORMAL HIGH (ref 1.4–7.0)
Neutrophils: 78 %
Platelets: 187 10*3/uL (ref 150–450)
RBC: 4.44 x10E6/uL (ref 3.77–5.28)
RDW: 12.4 % (ref 11.7–15.4)
RPR Ser Ql: NONREACTIVE
Rh Factor: POSITIVE
Rubella Antibodies, IGG: 6.87 index (ref >0.99)
WBC: 10.3 10*3/uL (ref 3.4–10.8)

## 2019-06-26 LAB — COMPREHENSIVE METABOLIC PANEL
ALT: 12 IU/L (ref 0–32)
AST: 18 IU/L (ref 0–40)
Albumin/Globulin Ratio: 1.7 (ref 1.2–2.2)
Albumin: 4.5 g/dL (ref 3.9–5.0)
Alkaline Phosphatase: 58 IU/L (ref 39–117)
BUN/Creatinine Ratio: 14 (ref 9–23)
BUN: 8 mg/dL (ref 6–20)
Bilirubin Total: 0.2 mg/dL (ref 0.0–1.2)
CO2: 20 mmol/L (ref 20–29)
Calcium: 9.2 mg/dL (ref 8.7–10.2)
Chloride: 102 mmol/L (ref 96–106)
Creatinine, Ser: 0.57 mg/dL (ref 0.57–1.00)
GFR calc Af Amer: 147 mL/min/{1.73_m2} (ref >59)
GFR calc non Af Amer: 127 mL/min/{1.73_m2} (ref >59)
Globulin, Total: 2.6 g/dL (ref 1.5–4.5)
Glucose: 65 mg/dL (ref 65–99)
Potassium: 4 mmol/L (ref 3.5–5.2)
Sodium: 138 mmol/L (ref 134–144)
Total Protein: 7.1 g/dL (ref 6.0–8.5)

## 2019-06-26 LAB — CERVICOVAGINAL ANCILLARY ONLY
Bacterial Vaginitis (gardnerella): POSITIVE — AB
Candida Glabrata: NEGATIVE
Candida Vaginitis: NEGATIVE
Chlamydia: NEGATIVE
Comment: NEGATIVE
Comment: NEGATIVE
Comment: NEGATIVE
Comment: NEGATIVE
Comment: NEGATIVE
Comment: NORMAL
Neisseria Gonorrhea: NEGATIVE
Trichomonas: NEGATIVE

## 2019-06-26 LAB — PROTEIN / CREATININE RATIO, URINE
Creatinine, Urine: 99.7 mg/dL
Protein, Ur: 12.8 mg/dL
Protein/Creat Ratio: 128 mg/g creat (ref 0–200)

## 2019-06-26 LAB — HEMOGLOBIN A1C
Est. average glucose Bld gHb Est-mCnc: 88 mg/dL
Hgb A1c MFr Bld: 4.7 % — ABNORMAL LOW (ref 4.8–5.6)

## 2019-06-30 LAB — URINE CULTURE, OB REFLEX

## 2019-06-30 LAB — CULTURE, OB URINE

## 2019-07-01 MED ORDER — AMOXICILLIN 500 MG PO CAPS: 500.0000 mg | ORAL_CAPSULE | Freq: Two times a day (BID) | ORAL | 0 refills | Status: DC

## 2019-07-01 NOTE — Addendum Note (Signed)
Addended by: Marcille Buffy D on: 07/01/2019 01:20 PM   Modules accepted: Orders

## 2019-07-07 ENCOUNTER — Encounter: Payer: Self-pay | Admitting: General Practice

## 2019-07-08 ENCOUNTER — Other Ambulatory Visit: Payer: Self-pay

## 2019-07-08 DIAGNOSIS — B9689 Other specified bacterial agents as the cause of diseases classified elsewhere: Secondary | ICD-10-CM

## 2019-07-08 MED ORDER — METRONIDAZOLE 0.75 % VA GEL: 1.0000 | Freq: Two times a day (BID) | VAGINAL | 0 refills | Status: DC

## 2019-07-08 MED ORDER — METRONIDAZOLE 1 % EX GEL: Freq: Every day | CUTANEOUS | 0 refills | Status: DC

## 2019-07-08 NOTE — Addendum Note (Signed)
Addended by: Louisa Second E on: 07/08/2019 01:43 PM   Modules accepted: Orders

## 2019-07-14 ENCOUNTER — Telehealth: Payer: Self-pay | Admitting: Lactation Services

## 2019-07-14 NOTE — Telephone Encounter (Signed)
Baby Scripts alert for BP of 107/92 several times.   Called pt, got a message that call could not be completed x 2.

## 2019-07-15 NOTE — Telephone Encounter (Signed)
Attempted to call pt and message received that call cannot be completed. Will send My Chart message.

## 2019-07-23 ENCOUNTER — Telehealth: Payer: Self-pay | Admitting: Student

## 2019-07-23 ENCOUNTER — Telehealth (INDEPENDENT_AMBULATORY_CARE_PROVIDER_SITE_OTHER): Payer: Medicaid Other | Admitting: Student

## 2019-07-23 ENCOUNTER — Other Ambulatory Visit: Payer: Self-pay

## 2019-07-23 DIAGNOSIS — Z3A17 17 weeks gestation of pregnancy: Secondary | ICD-10-CM | POA: Diagnosis not present

## 2019-07-23 DIAGNOSIS — R8271 Bacteriuria: Secondary | ICD-10-CM

## 2019-07-23 DIAGNOSIS — O0990 Supervision of high risk pregnancy, unspecified, unspecified trimester: Secondary | ICD-10-CM

## 2019-07-23 DIAGNOSIS — Z3480 Encounter for supervision of other normal pregnancy, unspecified trimester: Secondary | ICD-10-CM

## 2019-07-23 DIAGNOSIS — O0992 Supervision of high risk pregnancy, unspecified, second trimester: Secondary | ICD-10-CM | POA: Diagnosis not present

## 2019-07-23 MED ORDER — PRENATAL PLUS 27-1 MG PO TABS: 1.0000 | ORAL_TABLET | Freq: Every day | ORAL | 4 refills | Status: DC

## 2019-07-23 NOTE — Telephone Encounter (Signed)
Spoke to patient and she verified that she is transferring her care to [facility]. Patient moved back to [place]. Patient will not have a follow-up appointment with [facility].

## 2019-07-23 NOTE — Telephone Encounter (Signed)
Attempted to contact patient to figure out whether or not she was transfer her care to [facility]. Patient is scheduled for an appointment on 1/18 with [facility]. Patient's next ob appointment was not scheduled at our office for the reason until we know which office she will be attending. No answer, left voicemail for patient to give the office a call back to clear up this concern.

## 2019-07-23 NOTE — Progress Notes (Signed)
Patient ID: Tara Romero, female   DOB: 01/15/1991, 28 y.o.   MRN: [id number] I connected with@ on 07/23/19 at 10:15 AM EST by: My Chart  and verified that I am speaking with the correct person using two identifiers.  Patient is located at home and provider is located at [place].     The purpose of this virtual visit is to provide medical care while limiting exposure to the novel coronavirus. I discussed the limitations, risks, security and privacy concerns of performing an evaluation and management service by My Chart  and the availability of in person appointments. I also discussed with the patient that there may be a patient responsible charge related to this service. By engaging in this virtual visit, you consent to the provision of healthcare.  Additionally, you authorize for your insurance to be billed for the services provided during this visit.  The patient expressed understanding and agreed to proceed.  The following staff members participated in the virtual visit:  My Chart     PRENATAL VISIT NOTE  Subjective:  Tara Romero is a 28 y.o. [id number] at [redacted]w[redacted]d  for phone visit for ongoing prenatal care.  She is currently monitored for the following issues for this low-risk pregnancy and has History of gestational hypertension; Missed ab; Supervision of high risk pregnancy, antepartum; and GBS bacteriuria on their problem list.  Patient reports no complaints. She feels some movement; no discharge, no bleeding. .  Contractions: Not present. Vag. Bleeding: None.  Movement: Present. Denies leaking of fluid.   The following portions of the patient's history were reviewed and updated as appropriate: allergies, current medications, past family history, past medical history, past social history, past surgical history and problem list.   Objective:   Vitals:   07/23/19 1023  BP: 90/62   Self-Obtained  Fetal Status:     Movement: Present     Assessment and Plan:  Pregnancy: G4P1021 at [redacted]w[redacted]d 1.  Supervision of high risk pregnancy, antepartum   2. Supervision of other normal pregnancy, antepartum   3. GBS bacteriuria    -patient still taking medicine for BV and GBS; will continue to finish treatment.  -She knows how to take her BP and chart in BabyRx -patient will start taking her baby ASA Preterm labor symptoms and general obstetric precautions including but not limited to vaginal bleeding, contractions, leaking of fluid and fetal movement were reviewed in detail with the patient.  Return in about 4 weeks (around 08/20/2019), or LROB on my chart.  Future Appointments  Date Time Provider [place]  08/07/2019  1:00 PM [place] MFC-US  08/07/2019  1:00 PM [place] 3 WH-MFCUS MFC-US  08/07/2019  2:10 PM WOC-WOCA LAB WOC-WOCA WOC  08/11/2019 10:00 AM Gae Dry, MD WS-WS None     Time spent on virtual visit: 12 minutes  [place], CNM

## 2019-07-30 ENCOUNTER — Encounter (HOSPITAL_COMMUNITY): Payer: Self-pay | Admitting: Family Medicine

## 2019-07-30 ENCOUNTER — Other Ambulatory Visit: Payer: Self-pay

## 2019-07-30 ENCOUNTER — Inpatient Hospital Stay (HOSPITAL_COMMUNITY)
Admission: AD | Admit: 2019-07-30 | Discharge: 2019-07-30 | Disposition: A | Discharge status: Home / Self Care | Payer: Medicaid Other | Attending: Family Medicine | Admitting: Family Medicine

## 2019-07-30 DIAGNOSIS — O368120 Decreased fetal movements, second trimester, not applicable or unspecified: Secondary | ICD-10-CM

## 2019-07-30 DIAGNOSIS — O34219 Maternal care for unspecified type scar from previous cesarean delivery: Secondary | ICD-10-CM | POA: Diagnosis not present

## 2019-07-30 DIAGNOSIS — Z3A18 18 weeks gestation of pregnancy: Secondary | ICD-10-CM | POA: Diagnosis not present

## 2019-07-30 DIAGNOSIS — O368190 Decreased fetal movements, unspecified trimester, not applicable or unspecified: Secondary | ICD-10-CM

## 2019-07-30 LAB — URINALYSIS, ROUTINE W REFLEX MICROSCOPIC
Bilirubin Urine: NEGATIVE
Glucose, UA: NEGATIVE mg/dL
Hgb urine dipstick: NEGATIVE
Ketones, ur: 20 mg/dL — AB
Nitrite: NEGATIVE
Protein, ur: NEGATIVE mg/dL
Specific Gravity, Urine: 1.014 (ref 1.005–1.030)
pH: 6 (ref 5.0–8.0)

## 2019-07-30 NOTE — MAU Note (Signed)
Decreased fetal movement for the past 2  days.  States has been feeling movement for about 3 wks. Low back ache, not a new problem.  Denies bleeding or d/c.

## 2019-07-30 NOTE — MAU Provider Note (Signed)
History     CSN: [id number]  Arrival date and time: 07/30/19 1747   First Provider Initiated Contact with Patient 07/30/19 1938      Chief Complaint  Patient presents with  . Decreased Fetal Movement   HPI   [name] is a 29 y.o. female [phone] @ 16w1dhere in MAU with decreased fetal movement. States she has been feeling her baby move randomly upuntil today and yesterday where she has felt no movement. She called her OB office today and they told her to lay on her left side and drink a Mt. Dew. If no movement then she should come to MAU for evaluation. Hx of miscarriage and just needs reassurance.   OB History    Gravida  4   Para  1   Term  1   Preterm      AB  2   Living  1     SAB  1   TAB      Ectopic      Multiple  0   Live Births  1           History reviewed. No pertinent past medical history.  Past Surgical History:  Procedure Laterality Date  . CESAREAN SECTION N/A 04/08/2015   Procedure: CESAREAN SECTION;  Surgeon: Tara Mood MD;  Location: [facility] ORS;  Service: Obstetrics;  Laterality: N/A;  . DILATION AND EVACUATION N/A 06/25/2018   Procedure: DILATATION AND EVACUATION;  Surgeon: Tara Dry MD;  Location: [facility] ORS;  Service: Gynecology;  Laterality: N/A;    History reviewed. No pertinent family history.  Social History   Tobacco Use  . Smoking status: Never Smoker  . Smokeless tobacco: Never Used  Substance Use Topics  . Alcohol use: No  . Drug use: No    Allergies: No Known Allergies  Medications Prior to Admission  Medication Sig Dispense Refill Last Dose  . prenatal vitamin w/FE, FA (PRENATAL 1 + 1) 27-1 MG TABS tablet Take 1 tablet by mouth daily at 12 noon. 30 tablet 4 07/29/2019 at Unknown time  . amoxicillin (AMOXIL) 500 MG capsule Take 1 capsule (500 mg total) by mouth 2 (two) times daily. 14 capsule 0   . Blood Pressure Monitoring (BLOOD PRESSURE KIT) DEVI 1 Device by Does not apply route as needed. 1  Device 0   . Doxylamine-Pyridoxine (DICLEGIS) 10-10 MG TBEC Take 2 tablets by mouth at bedtime. If symptoms persist, add one tablet in the morning and one in the afternoon (Patient not taking: Reported on 06/25/2019) 100 tablet 5   . metroNIDAZOLE (METROGEL) 0.75 % vaginal gel Place 1 Applicatorful vaginally 2 (two) times daily. 70 g 0   . terconazole (TERAZOL 7) 0.4 % vaginal cream Place 1 applicator vaginally at bedtime. (Patient not taking: Reported on 06/25/2019) 45 g 0    Results for orders placed or performed during the hospital encounter of 07/30/19 (from the past 48 hour(s))  Urinalysis, Routine w reflex microscopic     Status: Abnormal   Collection Time: 07/30/19  6:57 PM  Result Value Ref Range   Color, Urine YELLOW YELLOW   APPearance HAZY (A) CLEAR   Specific Gravity, Urine 1.014 1.005 - 1.030   pH 6.0 5.0 - 8.0   Glucose, UA NEGATIVE NEGATIVE mg/dL   Hgb urine dipstick NEGATIVE NEGATIVE   Bilirubin Urine NEGATIVE NEGATIVE   Ketones, ur 20 (A) NEGATIVE mg/dL   Protein, ur NEGATIVE NEGATIVE mg/dL   Nitrite NEGATIVE NEGATIVE  Leukocytes,Ua TRACE (A) NEGATIVE   RBC / HPF 0-5 0 - 5 RBC/hpf   WBC, UA 0-5 0 - 5 WBC/hpf   Bacteria, UA RARE (A) NONE SEEN   Squamous Epithelial / LPF 0-5 0 - 5   Mucus PRESENT     Comment: Performed at [facility], [place] [address], [place], [place] [address]   Review of Systems  Constitutional: Negative for fever.  Gastrointestinal: Negative for abdominal pain.  Genitourinary: Negative for vaginal bleeding and vaginal discharge.   Physical Exam   Blood pressure 130/70, pulse 80, temperature 98.2 F (36.8 C), temperature source Oral, resp. rate 16, last menstrual period 03/25/2019, SpO2 100 %, unknown if currently breastfeeding.  Physical Exam  Constitutional: She is oriented to person, place, and time. She appears well-developed and well-nourished. No distress.  HENT:  Head: Normocephalic.  GI: Soft. She exhibits no distension. There is  no abdominal tenderness. There is no rebound.  Musculoskeletal:        General: Normal range of motion.  Neurological: She is alert and oriented to person, place, and time.  Skin: Skin is warm. She is not diaphoretic.  Psychiatric: Her behavior is normal.    MAU Course  Procedures   Pt informed that the ultrasound is considered a limited OB ultrasound and is not intended to be a complete ultrasound exam.  Patient also informed that the ultrasound is not being completed with the intent of assessing for fetal or placental anomalies or any pelvic abnormalities.  Explained that the purpose of today's ultrasound is to assess for viability.  Patient acknowledges the purpose of the exam and the limitations of the study.    MDM  + fetal heart tones via doppler Bedside US done for reassurance. Urine culture obtained and pending.   Assessment and Plan   A:  1. Decreased fetal movement affecting management of pregnancy, antepartum, single or unspecified fetus   2. [redacted] weeks gestation of pregnancy      P:  Discharge home in stable condition Reassurance given, patient feels better Discussed fetal kick counts starting after 28 weeks Return to MAU if symptoms worsen  Tara Romero, Tara Pais, NP 07/31/2019 8:29 AM

## 2019-07-30 NOTE — Discharge Instructions (Signed)

## 2019-07-31 ENCOUNTER — Encounter: Payer: Self-pay | Admitting: *Deleted

## 2019-08-01 LAB — CULTURE, OB URINE: Special Requests: NORMAL

## 2019-08-05 ENCOUNTER — Other Ambulatory Visit: Payer: Self-pay | Admitting: Lactation Services

## 2019-08-05 DIAGNOSIS — O0990 Supervision of high risk pregnancy, unspecified, unspecified trimester: Secondary | ICD-10-CM

## 2019-08-07 ENCOUNTER — Ambulatory Visit (HOSPITAL_COMMUNITY): Payer: Medicaid Other | Admitting: *Deleted

## 2019-08-07 ENCOUNTER — Other Ambulatory Visit: Payer: Self-pay

## 2019-08-07 ENCOUNTER — Encounter (HOSPITAL_COMMUNITY): Payer: Self-pay

## 2019-08-07 ENCOUNTER — Ambulatory Visit (HOSPITAL_COMMUNITY)
Admission: RE | Admit: 2019-08-07 | Discharge: 2019-08-07 | Disposition: A | Discharge status: Home / Self Care | Payer: Medicaid Other | Source: Ambulatory Visit | Attending: Obstetrics and Gynecology | Admitting: Obstetrics and Gynecology

## 2019-08-07 ENCOUNTER — Other Ambulatory Visit: Payer: Medicaid Other

## 2019-08-07 DIAGNOSIS — O09292 Supervision of pregnancy with other poor reproductive or obstetric history, second trimester: Secondary | ICD-10-CM

## 2019-08-07 DIAGNOSIS — O34219 Maternal care for unspecified type scar from previous cesarean delivery: Secondary | ICD-10-CM

## 2019-08-07 DIAGNOSIS — O0990 Supervision of high risk pregnancy, unspecified, unspecified trimester: Secondary | ICD-10-CM

## 2019-08-07 DIAGNOSIS — Z8759 Personal history of other complications of pregnancy, childbirth and the puerperium: Secondary | ICD-10-CM | POA: Diagnosis present

## 2019-08-07 DIAGNOSIS — Z3A19 19 weeks gestation of pregnancy: Secondary | ICD-10-CM

## 2019-08-09 LAB — AFP, SERUM, OPEN SPINA BIFIDA
AFP MoM: 1.37
AFP Value: 69.5 ng/mL
Gest. Age on Collection Date: 19.2 weeks
Maternal Age At EDD: 28.1 yr
OSBR Risk 1 IN: 3920
Test Results:: NEGATIVE
Weight: 152 [lb_av]

## 2019-08-11 ENCOUNTER — Encounter: Payer: Medicaid Other | Admitting: Obstetrics & Gynecology

## 2019-08-12 ENCOUNTER — Other Ambulatory Visit: Payer: Medicaid Other

## 2019-08-22 ENCOUNTER — Telehealth: Payer: Self-pay | Admitting: *Deleted

## 2019-08-22 NOTE — Telephone Encounter (Addendum)
Received call from [employer] representative who stated that pt had BP elevation of 113/94 last evening @ 8:13 pm, the recheck was the same.   1335  Called pt and she stated that she has not checked her BP again since last night. She is currently @ work and cannot check it. She reports having occasional H/A which are common for her and is relieved with Tylenol. She denies visual disturbances. I requested that pt check her BP later today after work. She should go to hospital for elevated BP, severe H/A or visual disturbances. Pt voiced understading of instructions given.

## 2019-08-28 ENCOUNTER — Ambulatory Visit (INDEPENDENT_AMBULATORY_CARE_PROVIDER_SITE_OTHER): Payer: Medicaid Other | Admitting: Obstetrics and Gynecology

## 2019-08-28 ENCOUNTER — Other Ambulatory Visit: Payer: Self-pay

## 2019-08-28 VITALS — BP 131/71 | HR 90 | Wt 157.1 lb

## 2019-08-28 DIAGNOSIS — O0992 Supervision of high risk pregnancy, unspecified, second trimester: Secondary | ICD-10-CM

## 2019-08-28 DIAGNOSIS — O34219 Maternal care for unspecified type scar from previous cesarean delivery: Secondary | ICD-10-CM

## 2019-08-28 DIAGNOSIS — O0990 Supervision of high risk pregnancy, unspecified, unspecified trimester: Secondary | ICD-10-CM

## 2019-08-28 DIAGNOSIS — Z8759 Personal history of other complications of pregnancy, childbirth and the puerperium: Secondary | ICD-10-CM

## 2019-08-28 DIAGNOSIS — Z3A22 22 weeks gestation of pregnancy: Secondary | ICD-10-CM

## 2019-08-28 NOTE — Progress Notes (Signed)
   PRENATAL VISIT NOTE  Subjective:  Tara Romero is a 29 y.o. G4P1021 at [redacted]w[redacted]d being seen today for ongoing prenatal care.  She is currently monitored for the following issues for this high-risk pregnancy and has History of gestational hypertension; Missed ab; Supervision of high risk pregnancy, antepartum; and GBS bacteriuria on their problem list.  Patient reports no complaints.  Contractions: Not present. Vag. Bleeding: None.  Movement: Present. Denies leaking of fluid.   The following portions of the patient's history were reviewed and updated as appropriate: allergies, current medications, past family history, past medical history, past social history, past surgical history and problem list.   Objective:   Vitals:   08/28/19 1627  BP: 131/71  Pulse: 90  Weight: 157 lb 1.6 oz (71.3 kg)    Fetal Status: Fetal Heart Rate (bpm): 156   Movement: Present     General:  Alert, oriented and cooperative. Patient is in no acute distress.  Skin: Skin is warm and dry. No rash noted.   Cardiovascular: Normal heart rate noted  Respiratory: Normal respiratory effort, no problems with respiration noted  Abdomen: Soft, gravid, appropriate for gestational age.  Pain/Pressure: Absent     Pelvic: Cervical exam deferred        Extremities: Normal range of motion.  Edema: None  Mental Status: Normal mood and affect. Normal behavior. Normal judgment and thought content.   Assessment and Plan:  Pregnancy: G4P1021 at [redacted]w[redacted]d  1. Supervision of high risk pregnancy, antepartum  Considering BTL however is not ready to sign consent.   2. History of gestational hypertension  BP is good BASA is ordered and she will start today.   3. History of cesarean delivery, currently pregnant  Traumatic experience, desires repeat however, would like to speak to MD to discuss risks of both.   Preterm labor symptoms and general obstetric precautions including but not limited to vaginal bleeding, contractions,  leaking of fluid and fetal movement were reviewed in detail with the patient. Please refer to After Visit Summary for other counseling recommendations.   Return in about 4 weeks (around 09/25/2019) for Schedule with MD to discuss TOLAC vs Repeat .  No future appointments.  Venia Carbon, NP

## 2019-08-31 DIAGNOSIS — O26892 Other specified pregnancy related conditions, second trimester: Secondary | ICD-10-CM

## 2019-09-01 MED ORDER — FAMOTIDINE 20 MG PO TABS: 20.0000 mg | ORAL_TABLET | Freq: Two times a day (BID) | ORAL | 0 refills | Status: DC

## 2019-09-24 ENCOUNTER — Telehealth: Payer: Medicaid Other | Admitting: Obstetrics & Gynecology

## 2019-09-24 ENCOUNTER — Telehealth (INDEPENDENT_AMBULATORY_CARE_PROVIDER_SITE_OTHER): Payer: Medicaid Other | Admitting: Obstetrics and Gynecology

## 2019-09-24 ENCOUNTER — Encounter: Payer: Self-pay | Admitting: Obstetrics and Gynecology

## 2019-09-24 ENCOUNTER — Other Ambulatory Visit: Payer: Self-pay

## 2019-09-24 DIAGNOSIS — R8271 Bacteriuria: Secondary | ICD-10-CM

## 2019-09-24 DIAGNOSIS — O139 Gestational [pregnancy-induced] hypertension without significant proteinuria, unspecified trimester: Secondary | ICD-10-CM

## 2019-09-24 DIAGNOSIS — K649 Unspecified hemorrhoids: Secondary | ICD-10-CM

## 2019-09-24 DIAGNOSIS — Z8759 Personal history of other complications of pregnancy, childbirth and the puerperium: Secondary | ICD-10-CM | POA: Diagnosis not present

## 2019-09-24 DIAGNOSIS — Z3A26 26 weeks gestation of pregnancy: Secondary | ICD-10-CM

## 2019-09-24 DIAGNOSIS — O0992 Supervision of high risk pregnancy, unspecified, second trimester: Secondary | ICD-10-CM | POA: Diagnosis not present

## 2019-09-24 DIAGNOSIS — Z98891 History of uterine scar from previous surgery: Secondary | ICD-10-CM | POA: Insufficient documentation

## 2019-09-24 DIAGNOSIS — O0990 Supervision of high risk pregnancy, unspecified, unspecified trimester: Secondary | ICD-10-CM

## 2019-09-24 MED ORDER — ASPIRIN EC 81 MG PO TBEC: 81.0000 mg | DELAYED_RELEASE_TABLET | Freq: Every day | ORAL | 1 refills | Status: DC

## 2019-09-24 NOTE — Progress Notes (Signed)
I connected with  Tara Romero on 09/24/19 at 1052 by MyChart and verified that I am speaking with the correct person using two identifiers.   I discussed the limitations, risks, security and privacy concerns of performing an evaluation and management service by telephone and the availability of in person appointments. I also discussed with the patient that there may be a patient responsible charge related to this service. The patient expressed understanding and agreed to proceed.  Pt does not have BP cuff with her at this time. Denies s/s of hypertension. Per pt, BP yesterday was 124/69. Pt will check BP when she is home from work and will record in [employer].  Called pt following visit at 1130 to let her know that Aspirin 81 mg has been sent to her pharmacy. Explained pt needs to take this daily to help prevent high blood pressure later in pregnancy.  Marjo Bicker, RN 09/24/2019  10:47 AM

## 2019-09-24 NOTE — Progress Notes (Signed)
   TELEHEALTH VIRTUAL OBSTETRICS VISIT ENCOUNTER NOTE  Clinic: [facility]  I connected with Tara Romero on 09/24/19 at 10:35 AM EST by telephone at home and verified that I am speaking with the correct person using two identifiers.   I discussed the limitations, risks, security and privacy concerns of performing an evaluation and management service by telephone and the availability of in person appointments. I also discussed with the patient that there may be a patient responsible charge related to this service. The patient expressed understanding and agreed to proceed.  Subjective:  Tara Romero is a 29 y.o. [phone] at [redacted]w[redacted]d being followed for ongoing prenatal care.  She is currently monitored for the following issues for this low-risk pregnancy and has History of gestational hypertension; Supervision of high risk pregnancy, antepartum; GBS bacteriuria; and History of cesarean delivery on their problem list.  Patient reports hemorrhoid that has gotten smaller. Reports fetal movement. Denies any contractions, bleeding or leaking of fluid.   The following portions of the patient's history were reviewed and updated as appropriate: allergies, current medications, past family history, past medical history, past social history, past surgical history and problem list.   Objective:  There were no vitals filed for this visit.  [employer] Data Reviewed: yes  General:  Alert, oriented and cooperative.   Mental Status: Normal mood and affect perceived. Normal judgment and thought content.  Rest of physical exam deferred due to type of encounter  Assessment and Plan:  Pregnancy: G4P1021 at [redacted]w[redacted]d 1. Supervision of high risk pregnancy, antepartum Routine care 28wk labs nv Follow BPs. Had some borderline BPs at home but office ones have always been fine  2. History of gestational hypertension See above Low dose asa sent in  3. GBS bacteriuria toc neg  4.  History of cesarean delivery 2016 pltcs after pushing for 3 hours, 6lbs 11 oz. I told her history is troubling and can talk to her more about this but at least would recommend 36-37wk growth u/s  5. Hemorrhoids, unspecified hemorrhoid type Recommend avoiding constipation, belly belt and otc meds   Preterm labor symptoms and general obstetric precautions including but not limited to vaginal bleeding, contractions, leaking of fluid and fetal movement were reviewed in detail with the patient.  I discussed the assessment and treatment plan with the patient. The patient was provided an opportunity to ask questions and all were answered. The patient agreed with the plan and demonstrated an understanding of the instructions. The patient was advised to call back or seek an in-person office evaluation/go to MAU at [facility] for any urgent or concerning symptoms. Please refer to After Visit Summary for other counseling recommendations.   I provided 7 minutes of non-face-to-face time during this encounter. The visit was conducted via MyChart-medicine  Return in about 1 week (around 10/01/2019) for 1-2wk hrob visit and 2h GTT, in person.  No future appointments.  Palco Bing, MD [facility] [employer], [facility]

## 2019-10-03 ENCOUNTER — Other Ambulatory Visit: Payer: Self-pay

## 2019-10-03 DIAGNOSIS — O0990 Supervision of high risk pregnancy, unspecified, unspecified trimester: Secondary | ICD-10-CM

## 2019-10-10 ENCOUNTER — Encounter: Payer: Medicaid Other | Admitting: Obstetrics & Gynecology

## 2019-10-10 ENCOUNTER — Other Ambulatory Visit: Payer: Medicaid Other

## 2019-10-20 ENCOUNTER — Ambulatory Visit (INDEPENDENT_AMBULATORY_CARE_PROVIDER_SITE_OTHER): Payer: Medicaid Other | Admitting: Family Medicine

## 2019-10-20 ENCOUNTER — Other Ambulatory Visit: Payer: Self-pay

## 2019-10-20 ENCOUNTER — Encounter: Payer: Self-pay | Admitting: *Deleted

## 2019-10-20 ENCOUNTER — Other Ambulatory Visit: Payer: Self-pay | Admitting: General Practice

## 2019-10-20 ENCOUNTER — Other Ambulatory Visit: Payer: Medicaid Other

## 2019-10-20 VITALS — BP 121/77 | HR 94 | Wt 165.7 lb

## 2019-10-20 DIAGNOSIS — Z23 Encounter for immunization: Secondary | ICD-10-CM

## 2019-10-20 DIAGNOSIS — O0990 Supervision of high risk pregnancy, unspecified, unspecified trimester: Secondary | ICD-10-CM

## 2019-10-20 DIAGNOSIS — Z98891 History of uterine scar from previous surgery: Secondary | ICD-10-CM

## 2019-10-20 DIAGNOSIS — Z8759 Personal history of other complications of pregnancy, childbirth and the puerperium: Secondary | ICD-10-CM

## 2019-10-20 NOTE — Patient Instructions (Signed)

## 2019-10-20 NOTE — Progress Notes (Signed)
   PRENATAL VISIT NOTE  Subjective:  Tara Romero is a 29 y.o. G4P1021 at [redacted]w[redacted]d being seen today for ongoing prenatal care.  She is currently monitored for the following issues for this low-risk pregnancy and has History of gestational hypertension; Supervision of high risk pregnancy, antepartum; GBS bacteriuria; and History of cesarean delivery on their problem list.  Patient reports no complaints.  Contractions: Not present. Vag. Bleeding: None.  Movement: Present. Denies leaking of fluid.   The following portions of the patient's history were reviewed and updated as appropriate: allergies, current medications, past family history, past medical history, past social history, past surgical history and problem list.   Objective:   Vitals:   10/20/19 0853  BP: 121/77  Pulse: 94  Weight: 165 lb 11.2 oz (75.2 kg)    Fetal Status: Fetal Heart Rate (bpm): 154 Fundal Height: 29 cm Movement: Present     General:  Alert, oriented and cooperative. Patient is in no acute distress.  Skin: Skin is warm and dry. No rash noted.   Cardiovascular: Normal heart rate noted  Respiratory: Normal respiratory effort, no problems with respiration noted  Abdomen: Soft, gravid, appropriate for gestational age.  Pain/Pressure: Absent     Pelvic: Cervical exam deferred        Extremities: Normal range of motion.  Edema: Trace  Mental Status: Normal mood and affect. Normal behavior. Normal judgment and thought content.   Assessment and Plan:  Pregnancy: G4P1021 at [redacted]w[redacted]d 1. Supervision of high risk pregnancy, antepartum 28 wk labs and TDaP today - Tdap vaccine greater than or equal to 7yo IM  2. History of cesarean delivery Desires RCS and BTL--ok for salpingectomy  3. History of gestational hypertension On Baby ASA--BP is ok--taking it at home at work frequently and has been ok  Preterm labor symptoms and general obstetric precautions including but not limited to vaginal bleeding, contractions,  leaking of fluid and fetal movement were reviewed in detail with the patient. Please refer to After Visit Summary for other counseling recommendations.   Return in 2 weeks (on 11/03/2019) for virtual, [facility].  Future Appointments  Date Time Provider Department Center  10/20/2019 10:00 AM [facility]    Reva Bores, MD

## 2019-10-21 LAB — CBC
Hematocrit: 36.7 % (ref 34.0–46.6)
Hemoglobin: 12.6 g/dL (ref 11.1–15.9)
MCH: 30.8 pg (ref 26.6–33.0)
MCHC: 34.3 g/dL (ref 31.5–35.7)
MCV: 90 fL (ref 79–97)
Platelets: 200 10*3/uL (ref 150–450)
RBC: 4.09 x10E6/uL (ref 3.77–5.28)
RDW: 11.7 % (ref 11.7–15.4)
WBC: 12.9 10*3/uL — ABNORMAL HIGH (ref 3.4–10.8)

## 2019-10-21 LAB — GLUCOSE TOLERANCE, 2 HOURS W/ 1HR
Glucose, 1 hour: 120 mg/dL (ref 65–179)
Glucose, 2 hour: 106 mg/dL (ref 65–152)
Glucose, Fasting: 68 mg/dL (ref 65–91)

## 2019-10-21 LAB — RPR: RPR Ser Ql: NONREACTIVE

## 2019-10-21 LAB — HIV ANTIBODY (ROUTINE TESTING W REFLEX): HIV Screen 4th Generation wRfx: NONREACTIVE

## 2019-10-24 ENCOUNTER — Other Ambulatory Visit: Payer: Self-pay | Admitting: Obstetrics and Gynecology

## 2019-11-04 ENCOUNTER — Telehealth (INDEPENDENT_AMBULATORY_CARE_PROVIDER_SITE_OTHER): Payer: Medicaid Other | Admitting: Obstetrics and Gynecology

## 2019-11-04 ENCOUNTER — Encounter: Payer: Self-pay | Admitting: Obstetrics and Gynecology

## 2019-11-04 VITALS — BP 121/76

## 2019-11-04 DIAGNOSIS — O0990 Supervision of high risk pregnancy, unspecified, unspecified trimester: Secondary | ICD-10-CM

## 2019-11-04 DIAGNOSIS — O98813 Other maternal infectious and parasitic diseases complicating pregnancy, third trimester: Secondary | ICD-10-CM

## 2019-11-04 DIAGNOSIS — R8271 Bacteriuria: Secondary | ICD-10-CM

## 2019-11-04 DIAGNOSIS — B379 Candidiasis, unspecified: Secondary | ICD-10-CM

## 2019-11-04 DIAGNOSIS — Z3A32 32 weeks gestation of pregnancy: Secondary | ICD-10-CM

## 2019-11-04 NOTE — Progress Notes (Signed)
I connected with  KHIANNA BLAZINA on 11/04/19 at  3:55 PM EDT by telephone and verified that I am speaking with the correct person using two identifiers.   I discussed the limitations, risks, security and privacy concerns of performing an evaluation and management service by telephone and the availability of in person appointments. I also discussed with the patient that there may be a patient responsible charge related to this service. The patient expressed understanding and agreed to proceed.  Ernestina Patches, CMA 11/04/2019  2:39 PM

## 2019-11-04 NOTE — Progress Notes (Addendum)
   MY CHART VIDEO VIRTUAL OBSTETRICS VISIT ENCOUNTER NOTE  I connected with STEFHANIE KACHMAR on 11/04/19 at  3:55 PM EDT by My Chart video at home and verified that I am speaking with the correct person using two identifiers. Provider location is [facility] office.   I discussed the limitations, risks, security and privacy concerns of performing an evaluation and management service by My Chart video and the availability of in person appointments. I also discussed with the patient that there may be a patient responsible charge related to this service. The patient expressed understanding and agreed to proceed.  Subjective:  IMAYA DUFFY is a 29 y.o. [phone] at [redacted]w[redacted]d being followed for ongoing prenatal care.  She is currently monitored for the following issues for this high-risk pregnancy and has History of gestational hypertension; Supervision of high risk pregnancy, antepartum; GBS bacteriuria; and History of cesarean delivery on their problem list.  Patient reports no complaints. Reports fetal movement. Denies any contractions, bleeding or leaking of fluid.   The following portions of the patient's history were reviewed and updated as appropriate: allergies, current medications, past family history, past medical history, past social history, past surgical history and problem list.   Objective:   General:  Alert, oriented and cooperative.   Mental Status: Normal mood and affect perceived. Normal judgment and thought content.  Rest of physical exam deferred due to type of encounter  BP 121/76   LMP 03/25/2019 (Exact Date)  **Done by patient's own at home BP cuff and scale  Assessment and Plan:  Pregnancy: G4P1021 at [redacted]w[redacted]d  1. Supervision of high risk pregnancy, antepartum - Anticipate screening for GC/CT at next visit  2. GBS bacteriuria - Will not require GBS screening at 36 wks  Preterm labor symptoms and general obstetric precautions including but not limited to vaginal bleeding,  contractions, leaking of fluid and fetal movement were reviewed in detail with the patient.  I discussed the assessment and treatment plan with the patient. The patient was provided an opportunity to ask questions and all were answered. The patient agreed with the plan and demonstrated an understanding of the instructions. The patient was advised to call back or seek an in-person office evaluation/go to MAU at [facility] for any urgent or concerning symptoms. Please refer to After Visit Summary for other counseling recommendations.   I provided 5 minutes of non-face-to-face time during this encounter. There was 5 minutes of chart review time spent prior to this encounter. Total time spent = 10 minutes.  Return in about 4 weeks (around 12/02/2019) for Return OB visit for GC/CT cultures.  Future Appointments  Date Time Provider Department Center  11/04/2019  3:55 PM Raelyn Mora, CNM [facility]    Raelyn Mora, CNM [facility] [employer], [facility]

## 2019-11-04 NOTE — Patient Instructions (Signed)
Healthy Weight Gain During Pregnancy, Adult A certain amount of weight gain during pregnancy is normal and healthy. How much weight you should gain depends on your overall health and a measurement called BMI (body mass index). BMI is an estimate of your body fat based on your height and weight. You can use an online calculator to figure out your BMI, or you can ask your health care provider to calculate it for you at your next visit. Your recommended pregnancy weight gain is based on your pre-pregnancy BMI. General guidelines for a healthy total weight gain during pregnancy are listed below. If your BMI at or before the start of your pregnancy is:  Less than 18.5 (underweight), you should gain 28-40 lb (13-18 kg).  18.5-24.9 (normal weight), you should gain 25-35 lb (11-16 kg).  25-29.9 (overweight), you should gain 15-25 lb (7-11 kg).  30 or higher (obese), you should gain 11-20 lb (5-9 kg). These ranges vary depending on your individual health. If you are carrying more than one baby (multiples), it may be safe to gain more weight than these recommendations. If you gain less weight than recommended, that may be safe as long as your baby is growing and developing normally. How can unhealthy weight gain affect me and my baby? Gaining too much weight during pregnancy can lead to pregnancy complications, such as:  A temporary form of diabetes that develops during pregnancy (gestational diabetes).  High blood pressure during pregnancy and protein in your urine (preeclampsia).  High blood pressure during pregnancy without protein in your urine (gestational hypertension).  Your baby having a high weight at birth, which may: ? Raise your risk of having a more difficult delivery or a surgical delivery (cesarean delivery, or C-section). ? Raise your child's risk of developing obesity during childhood. Not gaining enough weight can be life-threatening for your baby, and it may raise your baby's chances  of:  Being born early (preterm).  Growing more slowly than normal during pregnancy (growth restriction).  Having a low weight at birth. What actions can I take to gain a healthy amount of weight during pregnancy? General instructions  Keep track of your weight gain during pregnancy.  Take over-the-counter and prescription medicines only as told by your health care provider. Take all prenatal supplements as directed.  Keep all health care visits during pregnancy (prenatal visits). These visits are a good time to discuss your weight gain. Your health care provider will weigh you at each visit to make sure you are gaining a healthy amount of weight. Nutrition   Eat a balanced, nutrient-rich diet. Eat plenty of: ? Fruits and vegetables, such as berries and broccoli. ? Whole grains, such as millet, barley, whole-wheat breads and cereals, and oatmeal. ? Low-fat dairy products or non-dairy products such as almond milk or rice milk. ? Protein foods, such as lean meat, chicken, eggs, and legumes (such as peas, beans, soybeans, and lentils).  Avoid foods that are fried or have a lot of fat, salt (sodium), or sugar.  Drink enough fluid to keep your urine pale yellow.  Choose healthy snack and drink options when you are at work or on the go: ? Drink water. Avoid soda, sports drinks, and juices that have added sugar. ? Avoid drinks with caffeine, such as coffee and energy drinks. ? Eat snacks that are high in protein, such as nuts, protein bars, and low-fat yogurt. ? Carry convenient snacks in your purse that do not need refrigeration, such as a pack of   trail mix, an apple, or a granola bar.  If you need help improving your diet, work with a health care provider or a diet and nutrition specialist (dietitian). Activity   Exercise regularly, as told by your health care provider. ? If you were active before becoming pregnant, you may be able to continue your regular fitness activities. ? If  you were not active before pregnancy, you may gradually build up to exercising for 30 or more minutes on most days of the week. This may include walking, swimming, or yoga.  Ask your health care provider what activities are safe for you. Talk with your health care provider about whether you may need to be excused from certain school or work activities. Where to find more information Learn more about managing your weight gain during pregnancy from:  American Pregnancy Association: www.americanpregnancy.org  U.[place]. Department of Agriculture pregnancy weight gain calculator: www.choosemyplate.gov Summary  Too much weight gain during pregnancy can lead to complications for you and your baby.  Find out your pre-pregnancy BMI to determine how much weight gain is healthy for you.  Eat nutritious foods and stay active.  Keep all of your prenatal visits as told by your health care provider. This information is not intended to replace advice given to you by your health care provider. Make sure you discuss any questions you have with your health care provider. Document Revised: 04/02/2019 Document Reviewed: 03/30/2017 Elsevier Patient Education  2020 Elsevier Inc.  

## 2019-11-07 ENCOUNTER — Inpatient Hospital Stay (HOSPITAL_COMMUNITY)
Admission: AD | Admit: 2019-11-07 | Discharge: 2019-11-07 | Disposition: A | Discharge status: Home / Self Care | Payer: Medicaid Other | Attending: Obstetrics and Gynecology | Admitting: Obstetrics and Gynecology

## 2019-11-07 ENCOUNTER — Other Ambulatory Visit: Payer: Self-pay

## 2019-11-07 DIAGNOSIS — Y92009 Unspecified place in unspecified non-institutional (private) residence as the place of occurrence of the external cause: Secondary | ICD-10-CM | POA: Diagnosis not present

## 2019-11-07 DIAGNOSIS — S41152A Open bite of left upper arm, initial encounter: Secondary | ICD-10-CM | POA: Diagnosis not present

## 2019-11-07 DIAGNOSIS — S51852A Open bite of left forearm, initial encounter: Secondary | ICD-10-CM | POA: Insufficient documentation

## 2019-11-07 DIAGNOSIS — O9A213 Injury, poisoning and certain other consequences of external causes complicating pregnancy, third trimester: Secondary | ICD-10-CM | POA: Diagnosis not present

## 2019-11-07 DIAGNOSIS — O26893 Other specified pregnancy related conditions, third trimester: Secondary | ICD-10-CM | POA: Insufficient documentation

## 2019-11-07 DIAGNOSIS — Z3A32 32 weeks gestation of pregnancy: Secondary | ICD-10-CM | POA: Diagnosis not present

## 2019-11-07 DIAGNOSIS — Y93F9 Activity, other caregiving: Secondary | ICD-10-CM | POA: Diagnosis not present

## 2019-11-07 DIAGNOSIS — W540XXA Bitten by dog, initial encounter: Secondary | ICD-10-CM | POA: Diagnosis not present

## 2019-11-07 NOTE — MAU Note (Signed)
.   Tara Romero is a 29 y.o. 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 Small bruise on left lower arm.  LMP: Onset of complaint: today around 1pm  Pain score: 0 Vitals:   11/07/19 1527  BP: 129/71  Pulse: 94  Resp: 16  Temp: 98.2 F (36.8 C)  SpO2: 100%     FHT:145 Lab orders placed from triage:

## 2019-11-07 NOTE — Discharge Instructions (Signed)
-  Keep ice on injury -Go to [facility] if worsens; take tylenol for pain.

## 2019-11-07 NOTE — MAU Provider Note (Signed)
Patient Tara Romero is a 29 y.o. [phone] at [redacted]w[redacted]d here with complaints of bruising at the site of a dog bite. She denies vaginal bleeding, contractions, vaginal discharge, or decreased fetal movements.   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 Did not break the skin; dog is up to date on shots. Patient did not want to come to MAU but patient's employer required her to come in and get checked out.   Patient's arm shows only slight bruising, no open wounds or puncture. FHR is 145. Patient is well-feeling and desires discharge.    Patient Vitals for the past 24 hrs:  BP Temp Pulse Resp SpO2 Height Weight  11/07/19 1527 129/71 98.2 F (36.8 C) 94 16 100 % 5\' 4"  (1.626 m) 76.7 kg     Zsazsa Bahena 11/07/2019, 3:42 PM

## 2019-11-10 MED ORDER — TERCONAZOLE 0.4 % VA CREA: 1.0000 | TOPICAL_CREAM | Freq: Every day | VAGINAL | 0 refills | Status: DC

## 2019-11-21 ENCOUNTER — Encounter (HOSPITAL_COMMUNITY): Payer: Self-pay | Admitting: Obstetrics and Gynecology

## 2019-11-21 ENCOUNTER — Other Ambulatory Visit: Payer: Self-pay

## 2019-11-21 ENCOUNTER — Inpatient Hospital Stay (HOSPITAL_COMMUNITY)
Admission: AD | Admit: 2019-11-21 | Discharge: 2019-11-21 | Disposition: A | Discharge status: Home / Self Care | Payer: Medicaid Other | Attending: Obstetrics and Gynecology | Admitting: Obstetrics and Gynecology

## 2019-11-21 DIAGNOSIS — O34219 Maternal care for unspecified type scar from previous cesarean delivery: Secondary | ICD-10-CM | POA: Diagnosis not present

## 2019-11-21 DIAGNOSIS — Z7982 Long term (current) use of aspirin: Secondary | ICD-10-CM | POA: Insufficient documentation

## 2019-11-21 DIAGNOSIS — Z3689 Encounter for other specified antenatal screening: Secondary | ICD-10-CM

## 2019-11-21 DIAGNOSIS — R102 Pelvic and perineal pain: Secondary | ICD-10-CM | POA: Diagnosis present

## 2019-11-21 DIAGNOSIS — Z8632 Personal history of gestational diabetes: Secondary | ICD-10-CM | POA: Insufficient documentation

## 2019-11-21 DIAGNOSIS — M549 Dorsalgia, unspecified: Secondary | ICD-10-CM | POA: Diagnosis not present

## 2019-11-21 DIAGNOSIS — O99891 Other specified diseases and conditions complicating pregnancy: Secondary | ICD-10-CM

## 2019-11-21 DIAGNOSIS — Z3A34 34 weeks gestation of pregnancy: Secondary | ICD-10-CM

## 2019-11-21 DIAGNOSIS — O0990 Supervision of high risk pregnancy, unspecified, unspecified trimester: Secondary | ICD-10-CM

## 2019-11-21 DIAGNOSIS — M545 Low back pain: Secondary | ICD-10-CM | POA: Diagnosis not present

## 2019-11-21 DIAGNOSIS — O26893 Other specified pregnancy related conditions, third trimester: Secondary | ICD-10-CM | POA: Diagnosis not present

## 2019-11-21 DIAGNOSIS — O0993 Supervision of high risk pregnancy, unspecified, third trimester: Secondary | ICD-10-CM | POA: Insufficient documentation

## 2019-11-21 HISTORY — DX: Gestational diabetes mellitus in pregnancy, unspecified control: O24.419

## 2019-11-21 LAB — URINALYSIS, ROUTINE W REFLEX MICROSCOPIC
Bilirubin Urine: NEGATIVE
Glucose, UA: NEGATIVE mg/dL
Hgb urine dipstick: NEGATIVE
Ketones, ur: NEGATIVE mg/dL
Leukocytes,Ua: NEGATIVE
Nitrite: NEGATIVE
Protein, ur: NEGATIVE mg/dL
Specific Gravity, Urine: 1.01 (ref 1.005–1.030)
pH: 5 (ref 5.0–8.0)

## 2019-11-21 LAB — WET PREP, GENITAL
Clue Cells Wet Prep HPF POC: NONE SEEN
Sperm: NONE SEEN
Trich, Wet Prep: NONE SEEN
Yeast Wet Prep HPF POC: NONE SEEN

## 2019-11-21 LAB — POCT FERN TEST: POCT Fern Test: NEGATIVE

## 2019-11-21 MED ORDER — CYCLOBENZAPRINE HCL 10 MG PO TABS: 10.0000 mg | ORAL_TABLET | Freq: Three times a day (TID) | ORAL | 0 refills | Status: DC | PRN

## 2019-11-21 NOTE — MAU Note (Signed)
Pt reports incresed pain and pressure in her lower abd and back since last night. Stated she has had some leaking (not sure if she urinated on herself but reports underwear was wet) good fetal movement felt.

## 2019-11-21 NOTE — Discharge Instructions (Signed)
Abdominal Pain During Pregnancy  Belly (abdominal) pain is common during pregnancy. There are many possible causes. Most of the time, it is not a serious problem. Other times, it can be a sign that something is wrong with the pregnancy. Always tell your doctor if you have belly pain. Follow these instructions at home:  Do not have sex or put anything in your vagina until your pain goes away completely.  Get plenty of rest until your pain gets better.  Drink enough fluid to keep your pee (urine) pale yellow.  Take over-the-counter and prescription medicines only as told by your doctor.  Keep all follow-up visits as told by your doctor. This is important. Contact a doctor if:  Your pain continues or gets worse after resting.  You have lower belly pain that: ? Comes and goes at regular times. ? Spreads to your back. ? Feels like menstrual cramps.  You have pain or burning when you pee (urinate). Get help right away if:  You have a fever or chills.  You have vaginal bleeding.  You are leaking fluid from your vagina.  You are passing tissue from your vagina.  You throw up (vomit) for more than 24 hours.  You have watery poop (diarrhea) for more than 24 hours.  Your baby is moving less than usual.  You feel very weak or faint.  You have shortness of breath.  You have very bad pain in your upper belly. Summary  Belly (abdominal) pain is common during pregnancy. There are many possible causes.  If you have belly pain during pregnancy, tell your doctor right away.  Keep all follow-up visits as told by your doctor. This is important. This information is not intended to replace advice given to you by your health care provider. Make sure you discuss any questions you have with your health care provider. Document Revised: 10/28/2018 Document Reviewed: 10/12/2016 Elsevier Patient Education  2020 Elsevier Inc.   Back Pain in Pregnancy Back pain during pregnancy is common.  Back pain may be caused by several factors that are related to changes during your pregnancy. Follow these instructions at home: Managing pain, stiffness, and swelling      If directed, for sudden (acute) back pain, put ice on the painful area. ? Put ice in a plastic bag. ? Place a towel between your skin and the bag. ? Leave the ice on for 20 minutes, 2-3 times per day.  If directed, apply heat to the affected area before you exercise. Use the heat source that your health care provider recommends, such as a moist heat pack or a heating pad. ? Place a towel between your skin and the heat source. ? Leave the heat on for 20-30 minutes. ? Remove the heat if your skin turns bright red. This is especially important if you are unable to feel pain, heat, or cold. You may have a greater risk of getting burned.  If directed, massage the affected area. Activity  Exercise as told by your health care provider. Gentle exercise is the best way to prevent or manage back pain.  Listen to your body when lifting. If lifting hurts, ask for help or bend your knees. This uses your leg muscles instead of your back muscles.  Squat down when picking up something from the floor. Do not bend over.  Only use bed rest for short periods as told by your health care provider. Bed rest should only be used for the most severe episodes of back  pain. Standing, sitting, and lying down  Do not stand in one place for long periods of time.  Use good posture when sitting. Make sure your head rests over your shoulders and is not hanging forward. Use a pillow on your lower back if necessary.  Try sleeping on your side, preferably the left side, with a pregnancy support pillow or 1-2 regular pillows between your legs. ? If you have back pain after a night's rest, your bed may be too soft. ? A firm mattress may provide more support for your back during pregnancy. General instructions  Do not wear high heels.  Eat a  healthy diet. Try to gain weight within your health care provider's recommendations.  Use a maternity girdle, elastic sling, or back brace as told by your health care provider.  Take over-the-counter and prescription medicines only as told by your health care provider.  Work with a physical therapist or massage therapist to find ways to manage back pain. Acupuncture or massage therapy may be helpful.  Keep all follow-up visits as told by your health care provider. This is important. Contact a health care provider if:  Your back pain interferes with your daily activities.  You have increasing pain in other parts of your body. Get help right away if:  You develop numbness, tingling, weakness, or problems with the use of your arms or legs.  You develop severe back pain that is not controlled with medicine.  You have a change in bowel or bladder control.  You develop shortness of breath, dizziness, or you faint.  You develop nausea, vomiting, or sweating.  You have back pain that is a rhythmic, cramping pain similar to labor pains. Labor pain is usually 1-2 minutes apart, lasts for about 1 minute, and involves a bearing down feeling or pressure in your pelvis.  You have back pain and your water breaks or you have vaginal bleeding.  You have back pain or numbness that travels down your leg.  Your back pain developed after you fell.  You develop pain on one side of your back.  You see blood in your urine.  You develop skin blisters in the area of your back pain. Summary  Back pain may be caused by several factors that are related to changes during your pregnancy.  Follow instructions as told by your health care provider for managing pain, stiffness, and swelling.  Exercise as told by your health care provider. Gentle exercise is the best way to prevent or manage back pain.  Take over-the-counter and prescription medicines only as told by your health care provider.  Keep all  follow-up visits as told by your health care provider. This is important. This information is not intended to replace advice given to you by your health care provider. Make sure you discuss any questions you have with your health care provider. Document Revised: 10/29/2018 Document Reviewed: 12/26/2017 Elsevier Patient Education  2020 Elsevier Inc.   [employer] of the uterus can occur throughout pregnancy, but they are not always a sign that you are in labor. You may have practice contractions called Braxton Hicks contractions. These false labor contractions are sometimes confused with true labor. What are [place] contractions? Braxton Hicks contractions are tightening movements that occur in the muscles of the uterus before labor. Unlike true labor contractions, these contractions do not result in opening (dilation) and thinning of the cervix. Toward the end of pregnancy (32-34 weeks), Braxton Hicks contractions can happen more often  and may become stronger. These contractions are sometimes difficult to tell apart from true labor because they can be very uncomfortable. You should not feel embarrassed if you go to the hospital with false labor. Sometimes, the only way to tell if you are in true labor is for your health care provider to look for changes in the cervix. The health care provider will do a physical exam and may monitor your contractions. If you are not in true labor, the exam should show that your cervix is not dilating and your water has not broken. If there are no other health problems associated with your pregnancy, it is completely safe for you to be sent home with false labor. You may continue to have Braxton Hicks contractions until you go into true labor. How to tell the difference between true labor and false labor True labor  Contractions last 30-70 seconds.  Contractions become very regular.  Discomfort is usually felt in the top of the  uterus, and it spreads to the lower abdomen and low back.  Contractions do not go away with walking.  Contractions usually become more intense and increase in frequency.  The cervix dilates and gets thinner. False labor  Contractions are usually shorter and not as strong as true labor contractions.  Contractions are usually irregular.  Contractions are often felt in the front of the lower abdomen and in the groin.  Contractions may go away when you walk around or change positions while lying down.  Contractions get weaker and are shorter-lasting as time goes on.  The cervix usually does not dilate or become thin. Follow these instructions at home:   Take over-the-counter and prescription medicines only as told by your health care provider.  Keep up with your usual exercises and follow other instructions from your health care provider.  Eat and drink lightly if you think you are going into labor.  If Braxton Hicks contractions are making you uncomfortable: ? Change your position from lying down or resting to walking, or change from walking to resting. ? Sit and rest in a tub of warm water. ? Drink enough fluid to keep your urine pale yellow. Dehydration may cause these contractions. ? Do slow and deep breathing several times an hour.  Keep all follow-up prenatal visits as told by your health care provider. This is important. Contact a health care provider if:  You have a fever.  You have continuous pain in your abdomen. Get help right away if:  Your contractions become stronger, more regular, and closer together.  You have fluid leaking or gushing from your vagina.  You pass blood-tinged mucus (bloody show).  You have bleeding from your vagina.  You have low back pain that you never had before.  You feel your babys head pushing down and causing pelvic pressure.  Your baby is not moving inside you as much as it used to. Summary  Contractions that occur before  labor are called Braxton Hicks contractions, false labor, or practice contractions.  Braxton Hicks contractions are usually shorter, weaker, farther apart, and less regular than true labor contractions. True labor contractions usually become progressively stronger and regular, and they become more frequent.  Manage discomfort from [facility] contractions by changing position, resting in a warm bath, drinking plenty of water, or practicing deep breathing. This information is not intended to replace advice given to you by your health care provider. Make sure you discuss any questions you have with your health care provider. Document  Revised: 06/22/2017 Document Reviewed: 11/23/2016 Elsevier Patient Education  2020 [employer].

## 2019-11-21 NOTE — MAU Provider Note (Signed)
History     CSN: [id number]  Arrival date and time: 11/21/19 1740   First Provider Initiated Contact with Patient 11/21/19 1848      Chief Complaint  Patient presents with  . Pelvic Pain   29 y.o. [id number] @34 .3 wks presenting with LBP, pelvic pressure, and LOF. Reports onset of pain last night. Pain became worse today. Rates pain 6/10. Has not tried anything for it. 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 Fluid had no color or odor. No leaking since. Reports IC last night. No VB. She had some abdominal tightening earlier prior to arrival. Reports good FM. Was treated for yeast infection last week, only used cream for 2 nights.   OB History    Gravida  4   Para  1   Term  1   Preterm      AB  2   Living  1     SAB  2   TAB      Ectopic      Multiple  0   Live Births  1           Past Medical History:  Diagnosis Date  . Gestational diabetes   . Missed ab 05/29/2018    Past Surgical History:  Procedure Laterality Date  . CESAREAN SECTION N/A 04/08/2015   Procedure: CESAREAN SECTION;  Surgeon: Malachy Mood, MD;  Location: [facility] ORS;  Service: Obstetrics;  Laterality: N/A;  . DILATION AND EVACUATION N/A 06/25/2018   Procedure: DILATATION AND EVACUATION;  Surgeon: Gae Dry, MD;  Location: [facility] ORS;  Service: Gynecology;  Laterality: N/A;    History reviewed. No pertinent family history.  Social History   Tobacco Use  . Smoking status: Never Smoker  . Smokeless tobacco: Never Used  Substance Use Topics  . Alcohol use: No  . Drug use: No    Allergies: No Known Allergies  Medications Prior to Admission  Medication Sig Dispense Refill Last Dose  . Blood Pressure Monitoring (BLOOD PRESSURE KIT) DEVI 1 Device by Does not apply route as needed. 1 Device 0 Past Week at Unknown time  . prenatal vitamin w/FE, FA (PRENATAL 1 + 1) 27-1 MG TABS tablet Take 1 tablet by mouth daily at 12 noon. 30 tablet 4  11/21/2019 at Unknown time  . terconazole (TERAZOL 7) 0.4 % vaginal cream Place 1 applicator vaginally at bedtime. 45 g 0 Past Month at Unknown time  . aspirin EC 81 MG tablet Take 1 tablet (81 mg total) by mouth daily. 60 tablet 1 More than a month at Unknown time  . famotidine (PEPCID) 20 MG tablet Take 1 tablet (20 mg total) by mouth 2 (two) times daily. (Patient not taking: Reported on 09/24/2019) 60 tablet 0     Review of Systems  Gastrointestinal: Negative for abdominal pain.  Genitourinary: Positive for pelvic pain. Negative for dysuria.  Musculoskeletal: Positive for back pain.   Physical Exam   Blood pressure 120/81, pulse (!) 101, temperature 98 F (36.7 C), temperature source Oral, resp. rate 18, height 5' 4"  (1.626 m), weight 78.9 kg, last menstrual period 03/25/2019, unknown if currently breastfeeding.  Physical Exam  Nursing note and vitals reviewed. Constitutional: She is oriented to person, place, and time. She appears well-developed and well-nourished. No distress.  HENT:  Head: Normocephalic and atraumatic.  Cardiovascular: Normal rate.  Respiratory: Effort normal. No respiratory distress.  GI: Soft. She exhibits no distension. There is no abdominal tenderness.  gravid  Genitourinary:    Genitourinary Comments: SSE: no pool, fern neg SVE: closed/thick/-2   Musculoskeletal:        General: Normal range of motion.     Cervical back: Normal range of motion.  Neurological: She is alert and oriented to person, place, and time.  Skin: Skin is warm and dry.  Psychiatric: She has a normal mood and affect.  EFM: 150 bpm, mod variability, + accels, no decels Toco: irregular  Results for orders placed or performed during the hospital encounter of 11/21/19 (from the past 24 hour(s))  Urinalysis, Routine w reflex microscopic     Status: Abnormal   Collection Time: 11/21/19  6:24 PM  Result Value Ref Range   Color, Urine YELLOW YELLOW   APPearance HAZY (A) CLEAR   Specific  Gravity, Urine 1.010 1.005 - 1.030   pH 5.0 5.0 - 8.0   Glucose, UA NEGATIVE NEGATIVE mg/dL   Hgb urine dipstick NEGATIVE NEGATIVE   Bilirubin Urine NEGATIVE NEGATIVE   Ketones, ur NEGATIVE NEGATIVE mg/dL   Protein, ur NEGATIVE NEGATIVE mg/dL   Nitrite NEGATIVE NEGATIVE   Leukocytes,Ua NEGATIVE NEGATIVE  Wet prep, genital     Status: Abnormal   Collection Time: 11/21/19  7:00 PM  Result Value Ref Range   Yeast Wet Prep HPF POC NONE SEEN NONE SEEN   Trich, Wet Prep NONE SEEN NONE SEEN   Clue Cells Wet Prep HPF POC NONE SEEN NONE SEEN   WBC, Wet Prep HPF POC FEW (A) NONE SEEN   Sperm NONE SEEN   POCT fern test     Status: None   Collection Time: 11/21/19  7:13 PM  Result Value Ref Range   POCT Fern Test Negative = intact amniotic membranes    MAU Course  Procedures  MDM Labs ordered and reviewed. No signs of UTI, PTL, or SROM. Cervix unchanged. Stable for discharge home.   Assessment and Plan   1. [redacted] weeks gestation of pregnancy   2. Supervision of high risk pregnancy, antepartum   3. NST (non-stress test) reactive   4. Back pain affecting pregnancy in third trimester   5. Pelvic pain affecting pregnancy in third trimester, antepartum    Discharge home Follow up at [facility] as scheduled PTL precautions Rx Flexeril Tylenol prn Heating pad prn  Allergies as of 11/21/2019   No Known Allergies     Medication List    TAKE these medications   aspirin EC 81 MG tablet Take 1 tablet (81 mg total) by mouth daily.   Blood Pressure Kit Devi 1 Device by Does not apply route as needed.   cyclobenzaprine 10 MG tablet Commonly known as: FLEXERIL Take 1 tablet (10 mg total) by mouth every 8 (eight) hours as needed (back pain).   famotidine 20 MG tablet Commonly known as: Pepcid Take 1 tablet (20 mg total) by mouth 2 (two) times daily.   prenatal vitamin w/FE, FA 27-1 MG Tabs tablet Take 1 tablet by mouth daily at 12 noon.   terconazole 0.4 % vaginal cream Commonly known as:  TERAZOL 7 Place 1 applicator vaginally at bedtime.      Julianne Handler, CNM 11/21/2019, 8:01 PM

## 2019-11-24 ENCOUNTER — Encounter: Payer: Self-pay | Admitting: Student

## 2019-11-27 ENCOUNTER — Other Ambulatory Visit: Payer: Self-pay | Admitting: Lactation Services

## 2019-11-27 DIAGNOSIS — Z3480 Encounter for supervision of other normal pregnancy, unspecified trimester: Secondary | ICD-10-CM

## 2019-11-27 MED ORDER — PRENATAL PLUS 27-1 MG PO TABS: 1.0000 | ORAL_TABLET | Freq: Every day | ORAL | 4 refills | Status: DC

## 2019-12-02 ENCOUNTER — Other Ambulatory Visit: Payer: Self-pay

## 2019-12-02 ENCOUNTER — Other Ambulatory Visit (HOSPITAL_COMMUNITY)
Admission: RE | Admit: 2019-12-02 | Discharge: 2019-12-02 | Disposition: A | Discharge status: Home / Self Care | Payer: Medicaid Other | Source: Ambulatory Visit | Attending: Nurse Practitioner | Admitting: Nurse Practitioner

## 2019-12-02 ENCOUNTER — Ambulatory Visit (INDEPENDENT_AMBULATORY_CARE_PROVIDER_SITE_OTHER): Payer: Medicaid Other | Admitting: Student

## 2019-12-02 VITALS — BP 133/79 | HR 95 | Wt 176.7 lb

## 2019-12-02 DIAGNOSIS — O0990 Supervision of high risk pregnancy, unspecified, unspecified trimester: Secondary | ICD-10-CM | POA: Insufficient documentation

## 2019-12-02 DIAGNOSIS — O34219 Maternal care for unspecified type scar from previous cesarean delivery: Secondary | ICD-10-CM

## 2019-12-02 DIAGNOSIS — Z3A36 36 weeks gestation of pregnancy: Secondary | ICD-10-CM

## 2019-12-02 NOTE — Progress Notes (Signed)
   PRENATAL VISIT NOTE  Subjective:  Tara Romero is a 29 y.o. [phone] at [redacted]w[redacted]d being seen today for ongoing prenatal care.  She is currently monitored for the following issues for this low-risk pregnancy and has History of gestational hypertension; Supervision of high risk pregnancy, antepartum; GBS bacteriuria; and History of cesarean delivery on their problem list.  Patient reports occasional contractions.  Contractions: Irregular. Vag. Bleeding: None.  Movement: Present. Denies leaking of fluid.   The following portions of the patient's history were reviewed and updated as appropriate: allergies, current medications, past family history, past medical history, past social history, past surgical history and problem list.   Objective:   Vitals:   12/02/19 1639  BP: 133/79  Pulse: 95  Weight: 176 lb 11.2 oz (80.2 kg)    Fetal Status: Fetal Heart Rate (bpm): 175 Fundal Height: 35 cm Movement: Present  Presentation: Vertex  General:  Alert, oriented and cooperative. Patient is in no acute distress.  Skin: Skin is warm and dry. No rash noted.   Cardiovascular: Normal heart rate noted  Respiratory: Normal respiratory effort, no problems with respiration noted  Abdomen: Soft, gravid, appropriate for gestational age.  Pain/Pressure: Present     Pelvic: Cervical exam performed in the presence of a chaperone Dilation: 2.5 Effacement (%): 50 Station: -3  Extremities: Normal range of motion.  Edema: Trace  Mental Status: Normal mood and affect. Normal behavior. Normal judgment and thought content.   Assessment and Plan:  Pregnancy: G4P1021 at [redacted]w[redacted]d 1. Supervision of high risk pregnancy, antepartum - GC/Chlamydia probe amp ([place])not at [facility]  Term labor symptoms and general obstetric precautions including but not limited to vaginal bleeding, contractions, leaking of fluid and fetal movement were reviewed in detail with the patient. Please refer to After Visit Summary for other  counseling recommendations.   Return in about 1 week (around 12/09/2019) for Routine OB, in person.  Future Appointments  Date Time Provider Department Center  12/09/2019 11:15 AM Marylene Land, CNM [facility]  12/16/2019 11:15 AM Marvetta Gibbons, Brand Males, NP [facility]    Judeth Horn, NP

## 2019-12-02 NOTE — Patient Instructions (Signed)
Cesarean Delivery Cesarean birth, or cesarean delivery, is the surgical delivery of a baby through an incision in the abdomen and the uterus. This may be referred to as a C-section. This procedure may be scheduled ahead of time, or it may be done in an emergency situation. Tell a health care provider about:  Any allergies you have.  All medicines you are taking, including vitamins, herbs, eye drops, creams, and over-the-counter medicines.  Any problems you or family members have had with anesthetic medicines.  Any blood disorders you have.  Any surgeries you have had.  Any medical conditions you have.  Whether you or any members of your family have a history of deep vein thrombosis (DVT) or pulmonary embolism (PE). What are the risks? Generally, this is a safe procedure. However, problems may occur, including:  Infection.  Bleeding.  Allergic reactions to medicines.  Damage to other structures or organs.  Blood clots.  Injury to your baby. What happens before the procedure? General instructions  Follow instructions from your health care provider about eating or drinking restrictions.  If you know that you are going to have a cesarean delivery, do not shave your pubic area. Shaving before the procedure may increase your risk of infection.  Plan to have someone take you home from the hospital.  Ask your health care provider what steps will be taken to prevent infection. These may include: ? Removing hair at the surgery site. ? Washing skin with a germ-killing soap. ? Taking antibiotic medicine.  Depending on the reason for your cesarean delivery, you may have a physical exam or additional testing, such as an ultrasound.  You may have your blood or urine tested. Questions for your health care provider  Ask your health care provider about: ? Changing or stopping your regular medicines. This is especially important if you are taking diabetes medicines or blood  thinners. ? Your pain management plan. This is especially important if you plan to breastfeed your baby. ? How long you will be in the hospital after the procedure. ? Any concerns you may have about receiving blood products, if you need them during the procedure. ? Cord blood banking, if you plan to collect your baby's umbilical cord blood.  You may also want to ask your health care provider: ? Whether you will be able to hold or breastfeed your baby while you are still in the operating room. ? Whether your baby can stay with you immediately after the procedure and during your recovery. ? Whether a family member or a person of your choice can go with you into the operating room and stay with you during the procedure, immediately after the procedure, and during your recovery. What happens during the procedure?   An IV will be inserted into one of your veins.  Fluid and medicines, such as antibiotics, will be given before the surgery.  Fetal monitors will be placed on your abdomen to check your baby's heart rate.  You may be given a special warming gown to wear to keep your temperature stable.  A catheter may be inserted into your bladder through your urethra. This drains your urine during the procedure.  You may be given one or more of the following: ? A medicine to numb the area (local anesthetic). ? A medicine to make you fall asleep (general anesthetic). ? A medicine (regional anesthetic) that is injected into your back or through a small thin tube placed in your back (spinal anesthetic or epidural anesthetic).   This numbs everything below the injection site and allows you to stay awake during your procedure. If this makes you feel nauseous, tell your health care provider. Medicines will be available to help reduce any nausea you may feel.  An incision will be made in your abdomen, and then in your uterus.  If you are awake during your procedure, you may feel tugging and pulling in  your abdomen, but you should not feel pain. If you feel pain, tell your health care provider immediately.  Your baby will be removed from your uterus. You may feel more pressure or pushing while this happens.  Immediately after birth, your baby will be dried and kept warm. You may be able to hold and breastfeed your baby.  The umbilical cord may be clamped and cut during this time. This usually occurs after waiting a period of 1-2 minutes after delivery.  Your placenta will be removed from your uterus.  Your incisions will be closed with stitches (sutures). Staples, skin glue, or adhesive strips may also be applied to the incision in your abdomen.  Bandages (dressings) may be placed over the incision in your abdomen. The procedure may vary among health care providers and hospitals. What happens after the procedure?  Your blood pressure, heart rate, breathing rate, and blood oxygen level will be monitored until you are discharged from the hospital.  You may continue to receive fluids and medicines through an IV.  You will have some pain. Medicines will be available to help control your pain.  To help prevent blood clots: ? You may be given medicines. ? You may have to wear compression stockings or devices. ? You will be encouraged to walk around when you are able.  Hospital staff will encourage and support bonding with your baby. Your hospital may have you and your baby to stay in the same room (rooming in) during your hospital stay to encourage successful bonding and breastfeeding.  You may be encouraged to cough and breathe deeply often. This helps to prevent lung problems.  If you have a catheter draining your urine, it will be removed as soon as possible after your procedure. Summary  Cesarean birth, or cesarean delivery, is the surgical delivery of a baby through an incision in the abdomen and the uterus.  Follow instructions from your health care provider about eating or  drinking restrictions before the procedure.  You will have some pain after the procedure. Medicines will be available to help control your pain.  Hospital staff will encourage and support bonding with your baby after the procedure. Your hospital may have you and your baby to stay in the same room (rooming in) during your hospital stay to encourage successful bonding and breastfeeding. This information is not intended to replace advice given to you by your health care provider. Make sure you discuss any questions you have with your health care provider. Document Revised: 01/14/2018 Document Reviewed: 01/14/2018 Elsevier Patient Education  2020 [employer]  

## 2019-12-04 LAB — GC/CHLAMYDIA PROBE AMP (~~LOC~~) NOT AT ARMC
Chlamydia: NEGATIVE
Comment: NEGATIVE
Comment: NORMAL
Neisseria Gonorrhea: NEGATIVE

## 2019-12-08 ENCOUNTER — Other Ambulatory Visit: Payer: Self-pay

## 2019-12-08 ENCOUNTER — Encounter (HOSPITAL_COMMUNITY): Payer: Self-pay | Admitting: Obstetrics and Gynecology

## 2019-12-08 ENCOUNTER — Inpatient Hospital Stay (HOSPITAL_COMMUNITY)
Admission: AD | Admit: 2019-12-08 | Discharge: 2019-12-08 | Disposition: A | Discharge status: Home / Self Care | Payer: Medicaid Other | Attending: Obstetrics and Gynecology | Admitting: Obstetrics and Gynecology

## 2019-12-08 DIAGNOSIS — Z3A00 Weeks of gestation of pregnancy not specified: Secondary | ICD-10-CM | POA: Diagnosis not present

## 2019-12-08 DIAGNOSIS — O479 False labor, unspecified: Secondary | ICD-10-CM | POA: Insufficient documentation

## 2019-12-08 DIAGNOSIS — O0990 Supervision of high risk pregnancy, unspecified, unspecified trimester: Secondary | ICD-10-CM

## 2019-12-08 NOTE — Discharge Instructions (Signed)
Braxton Hicks Contractions °Contractions of the uterus can occur throughout pregnancy, but they are not always a sign that you are in labor. You may have practice contractions called Braxton Hicks contractions. These false labor contractions are sometimes confused with true labor. °What are Braxton Hicks contractions? °Braxton Hicks contractions are tightening movements that occur in the muscles of the uterus before labor. Unlike true labor contractions, these contractions do not result in opening (dilation) and thinning of the cervix. Toward the end of pregnancy (32-34 weeks), Braxton Hicks contractions can happen more often and may become stronger. These contractions are sometimes difficult to tell apart from true labor because they can be very uncomfortable. You should not feel embarrassed if you go to the hospital with false labor. °Sometimes, the only way to tell if you are in true labor is for your health care provider to look for changes in the cervix. The health care provider will do a physical exam and may monitor your contractions. If you are not in true labor, the exam should show that your cervix is not dilating and your water has not broken. °If there are no other health problems associated with your pregnancy, it is completely safe for you to be sent home with false labor. You may continue to have Braxton Hicks contractions until you go into true labor. °How to tell the difference between true labor and false labor °True labor °· Contractions last 30-70 seconds. °· Contractions become very regular. °· Discomfort is usually felt in the top of the uterus, and it spreads to the lower abdomen and low back. °· Contractions do not go away with walking. °· Contractions usually become more intense and increase in frequency. °· The cervix dilates and gets thinner. °False labor °· Contractions are usually shorter and not as strong as true labor contractions. °· Contractions are usually irregular. °· Contractions  are often felt in the front of the lower abdomen and in the groin. °· Contractions may go away when you walk around or change positions while lying down. °· Contractions get weaker and are shorter-lasting as time goes on. °· The cervix usually does not dilate or become thin. °Follow these instructions at home: ° °· Take over-the-counter and prescription medicines only as told by your health care provider. °· Keep up with your usual exercises and follow other instructions from your health care provider. °· Eat and drink lightly if you think you are going into labor. °· If Braxton Hicks contractions are making you uncomfortable: °? Change your position from lying down or resting to walking, or change from walking to resting. °? Sit and rest in a tub of warm water. °? Drink enough fluid to keep your urine pale yellow. Dehydration may cause these contractions. °? Do slow and deep breathing several times an hour. °· Keep all follow-up prenatal visits as told by your health care provider. This is important. °Contact a health care provider if: °· You have a fever. °· You have continuous pain in your abdomen. °Get help right away if: °· Your contractions become stronger, more regular, and closer together. °· You have fluid leaking or gushing from your vagina. °· You pass blood-tinged mucus (bloody show). °· You have bleeding from your vagina. °· You have low back pain that you never had before. °· You feel your baby’s head pushing down and causing pelvic pressure. °· Your baby is not moving inside you as much as it used to. °Summary °· Contractions that occur before labor are   called Braxton Hicks contractions, false labor, or practice contractions. °· Braxton Hicks contractions are usually shorter, weaker, farther apart, and less regular than true labor contractions. True labor contractions usually become progressively stronger and regular, and they become more frequent. °· Manage discomfort from Braxton Hicks contractions  by changing position, resting in a warm bath, drinking plenty of water, or practicing deep breathing. °This information is not intended to replace advice given to you by your health care provider. Make sure you discuss any questions you have with your health care provider. °Document Revised: 06/22/2017 Document Reviewed: 11/23/2016 °Elsevier Patient Education © 2020 Elsevier Inc. ° °

## 2019-12-08 NOTE — MAU Note (Signed)
t stated she r mucus plug started coming out on Thursday. Thinks it all out now and reports increased ctx and increased pelvic pressure. Good fetal movement felt.

## 2019-12-08 NOTE — MAU Note (Signed)
I have communicated with M.bhambri,CNM and reviewed vital signs:  Vitals:   12/08/19 1729  BP: 133/79  Pulse: 97  Resp: 18  Temp: 98.3 F (36.8 C)    Vaginal exam:  Dilation: 2.5 Effacement (%): 50 Cervical Position: Posterior Station: -3 Presentation: Vertex Exam by:: K.Jesenya Bowditch,RN,   Also reviewed contraction pattern and that non-stress test is reactive.  It has been documented that patient is not  contracting anymore with no cervical change since visit last week not indicating active labor.  Patient denies any other complaints.  Based on this report provider has given order for discharge.  A discharge order and diagnosis entered by a provider.   Labor discharge instructions reviewed with patient.

## 2019-12-09 ENCOUNTER — Ambulatory Visit (INDEPENDENT_AMBULATORY_CARE_PROVIDER_SITE_OTHER): Payer: Medicaid Other | Admitting: Student

## 2019-12-09 VITALS — BP 137/85 | HR 91 | Wt 179.0 lb

## 2019-12-09 DIAGNOSIS — O34219 Maternal care for unspecified type scar from previous cesarean delivery: Secondary | ICD-10-CM

## 2019-12-09 DIAGNOSIS — Z3A37 37 weeks gestation of pregnancy: Secondary | ICD-10-CM

## 2019-12-09 DIAGNOSIS — Z3480 Encounter for supervision of other normal pregnancy, unspecified trimester: Secondary | ICD-10-CM

## 2019-12-09 NOTE — Patient Instructions (Signed)
Cesarean Delivery, Care After This sheet gives you information about how to care for yourself after your procedure. Your health care provider may also give you more specific instructions. If you have problems or questions, contact your health care provider. What can I expect after the procedure? After the procedure, it is common to have:  A small amount of blood or clear fluid coming from the incision.  Some redness, swelling, and pain in your incision area.  Some abdominal pain and soreness.  Vaginal bleeding (lochia). Even though you did not have a vaginal delivery, you will still have vaginal bleeding and discharge.  Pelvic cramps.  Fatigue. You may have pain, swelling, and discomfort in the tissue between your vagina and your anus (perineum) if:  Your C-section was unplanned, and you were allowed to labor and push.  An incision was made in the area (episiotomy) or the tissue tore during attempted vaginal delivery. Follow these instructions at home: Incision care   Follow instructions from your health care provider about how to take care of your incision. Make sure you: ? Wash your hands with soap and water before you change your bandage (dressing). If soap and water are not available, use hand sanitizer. ? If you have a dressing, change it or remove it as told by your health care provider. ? Leave stitches (sutures), skin staples, skin glue, or adhesive strips in place. These skin closures may need to stay in place for 2 weeks or longer. If adhesive strip edges start to loosen and curl up, you may trim the loose edges. Do not remove adhesive strips completely unless your health care provider tells you to do that.  Check your incision area every day for signs of infection. Check for: ? More redness, swelling, or pain. ? More fluid or blood. ? Warmth. ? Pus or a bad smell.  Do not take baths, swim, or use a hot tub until your health care provider says it's okay. Ask your health  care provider if you can take showers.  When you cough or sneeze, hug a pillow. This helps with pain and decreases the chance of your incision opening up (dehiscing). Do this until your incision heals. Medicines  Take over-the-counter and prescription medicines only as told by your health care provider.  If you were prescribed an antibiotic medicine, take it as told by your health care provider. Do not stop taking the antibiotic even if you start to feel better.  Do not drive or use heavy machinery while taking prescription pain medicine. Lifestyle  Do not drink alcohol. This is especially important if you are breastfeeding or taking pain medicine.  Do not use any products that contain nicotine or tobacco, such as cigarettes, e-cigarettes, and chewing tobacco. If you need help quitting, ask your health care provider. Eating and drinking  Drink at least 8 eight-ounce glasses of water every day unless told not to by your health care provider. If you breastfeed, you may need to drink even more water.  Eat high-fiber foods every day. These foods may help prevent or relieve constipation. High-fiber foods include: ? Whole grain cereals and breads. ? Brown rice. ? Beans. ? Fresh fruits and vegetables. Activity   If possible, have someone help you care for your baby and help with household activities for at least a few days after you leave the hospital.  Return to your normal activities as told by your health care provider. Ask your health care provider what activities are safe for   you.  Rest as much as possible. Try to rest or take a nap while your baby is sleeping.  Do not lift anything that is heavier than 10 lbs (4.5 kg), or the limit that you were told, until your health care provider says that it is safe.  Talk with your health care provider about when you can engage in sexual activity. This may depend on your: ? Risk of infection. ? How fast you heal. ? Comfort and desire to  engage in sexual activity. General instructions  Do not use tampons or douches until your health care provider approves.  Wear loose, comfortable clothing and a supportive and well-fitting bra.  Keep your perineum clean and dry. Wipe from front to back when you use the toilet.  If you pass a blood clot, save it and call your health care provider to discuss. Do not flush blood clots down the toilet before you get instructions from your health care provider.  Keep all follow-up visits for you and your baby as told by your health care provider. This is important. Contact a health care provider if:  You have: ? A fever. ? Bad-smelling vaginal discharge. ? Pus or a bad smell coming from your incision. ? Difficulty or pain when urinating. ? A sudden increase or decrease in the frequency of your bowel movements. ? More redness, swelling, or pain around your incision. ? More fluid or blood coming from your incision. ? A rash. ? Nausea. ? Little or no interest in activities you used to enjoy. ? Questions about caring for yourself or your baby.  Your incision feels warm to the touch.  Your breasts turn red or become painful or hard.  You feel unusually sad or worried.  You vomit.  You pass a blood clot from your vagina.  You urinate more than usual.  You are dizzy or light-headed. Get help right away if:  You have: ? Pain that does not go away or get better with medicine. ? Chest pain. ? Difficulty breathing. ? Blurred vision or spots in your vision. ? Thoughts about hurting yourself or your baby. ? New pain in your abdomen or in one of your legs. ? A severe headache.  You faint.  You bleed from your vagina so much that you fill more than one sanitary pad in one hour. Bleeding should not be heavier than your heaviest period. Summary  After the procedure, it is common to have pain at your incision site, abdominal cramping, and slight bleeding from your vagina.  Check  your incision area every day for signs of infection.  Tell your health care provider about any unusual symptoms.  Keep all follow-up visits for you and your baby as told by your health care provider. This information is not intended to replace advice given to you by your health care provider. Make sure you discuss any questions you have with your health care provider. Document Revised: 01/16/2018 Document Reviewed: 01/16/2018 Elsevier Patient Education  2020 Elsevier Inc.  

## 2019-12-09 NOTE — Progress Notes (Signed)
    PRENATAL VISIT NOTE  Subjective:  Tara Romero is a 29 y.o. [phone] at [redacted]w[redacted]d being seen today for ongoing prenatal care.  She is currently monitored for the following issues for this high-risk pregnancy and has History of gestational hypertension; Supervision of high risk pregnancy, antepartum; GBS bacteriuria; and History of cesarean delivery on their problem list.  Patient reports no complaints. She was in the MAU last night with contractions; was told that she was still 2-2.5 cm.  Contractions: Irritability. Vag. Bleeding: None.  Movement: Present. Denies leaking of fluid.   The following portions of the patient's history were reviewed and updated as appropriate: allergies, current medications, past family history, past medical history, past social history, past surgical history and problem list.   Objective:   Vitals:   12/09/19 1137  BP: 137/85  Pulse: 91  Weight: 179 lb (81.2 kg)    Fetal Status: Fetal Heart Rate (bpm): 157   Movement: Present     General:  Alert, oriented and cooperative. Patient is in no acute distress.  Skin: Skin is warm and dry. No rash noted.   Cardiovascular: Normal heart rate noted  Respiratory: Normal respiratory effort, no problems with respiration noted  Abdomen: Soft, gravid, appropriate for gestational age.  Pain/Pressure: Present     Pelvic: Cervical exam deferred        Extremities: Normal range of motion.  Edema: Trace  Mental Status: Normal mood and affect. Normal behavior. Normal judgment and thought content.   Assessment and Plan:  Pregnancy: [id number] at [redacted]w[redacted]d  1. Supervision of other normal pregnancy, antepartum   -Patient is interested in VBAC if she goes into labor; did not sign consent today as she "doesn't think its going to happen". Does not want to delay her C/section ; she wants to keep that scheduled at 39 weeks.  -She has normal BP today; she is taking her BP at home Term labor symptoms and general obstetric precautions  including but not limited to vaginal bleeding, contractions, leaking of fluid and fetal movement were reviewed in detail with the patient. Please refer to After Visit Summary for other counseling recommendations.   No follow-ups on file.  Future Appointments  Date Time Provider Department Center  12/16/2019 11:15 AM Currie Paris, NP [facility]    Marylene Land, [place]

## 2019-12-10 NOTE — Patient Instructions (Signed)
AURY SCOLLARD  12/10/2019   Your procedure is scheduled on:  12/24/2019  Arrive at 1030 at Entrance C on [address] at [facility]  and [employer]. You are invited to use the FREE valet parking or use the Visitor's parking deck.  Pick up the phone at the desk and dial [phone].  Call this number if you have problems the morning of surgery: [phone]  Remember:   Do not eat food:(After Midnight) Desps de medianoche.  Do not drink clear liquids: (After Midnight) Desps de medianoche.  Take these medicines the morning of surgery with A SIP OF WATER:  none   Do not wear jewelry, make-up or nail polish.  Do not wear lotions, powders, or perfumes. Do not wear deodorant.  Do not shave 48 hours prior to surgery.  Do not bring valuables to the hospital.  [facility] is not   responsible for any belongings or valuables brought to the hospital.  Contacts, dentures or bridgework may not be worn into surgery.  Leave suitcase in the car. After surgery it may be brought to your room.  For patients admitted to the hospital, checkout time is 11:00 AM the day of              discharge.      Please read over the following fact sheets that you were given:     Preparing for Surgery

## 2019-12-11 ENCOUNTER — Telehealth (HOSPITAL_COMMUNITY): Payer: Self-pay | Admitting: *Deleted

## 2019-12-11 NOTE — Telephone Encounter (Signed)
Preadmission screen  

## 2019-12-12 ENCOUNTER — Encounter (HOSPITAL_COMMUNITY): Payer: Self-pay

## 2019-12-16 ENCOUNTER — Ambulatory Visit (INDEPENDENT_AMBULATORY_CARE_PROVIDER_SITE_OTHER): Payer: Medicaid Other | Admitting: Nurse Practitioner

## 2019-12-16 ENCOUNTER — Other Ambulatory Visit: Payer: Self-pay

## 2019-12-16 VITALS — BP 130/78 | HR 84 | Wt 183.0 lb

## 2019-12-16 DIAGNOSIS — R030 Elevated blood-pressure reading, without diagnosis of hypertension: Secondary | ICD-10-CM

## 2019-12-16 DIAGNOSIS — O34219 Maternal care for unspecified type scar from previous cesarean delivery: Secondary | ICD-10-CM

## 2019-12-16 DIAGNOSIS — Z98891 History of uterine scar from previous surgery: Secondary | ICD-10-CM

## 2019-12-16 DIAGNOSIS — O0990 Supervision of high risk pregnancy, unspecified, unspecified trimester: Secondary | ICD-10-CM

## 2019-12-16 DIAGNOSIS — Z3A38 38 weeks gestation of pregnancy: Secondary | ICD-10-CM

## 2019-12-16 DIAGNOSIS — O09293 Supervision of pregnancy with other poor reproductive or obstetric history, third trimester: Secondary | ICD-10-CM

## 2019-12-16 DIAGNOSIS — Z8759 Personal history of other complications of pregnancy, childbirth and the puerperium: Secondary | ICD-10-CM

## 2019-12-16 LAB — POCT URINALYSIS DIP (DEVICE)
Bilirubin Urine: NEGATIVE
Glucose, UA: NEGATIVE mg/dL
Hgb urine dipstick: NEGATIVE
Ketones, ur: NEGATIVE mg/dL
Nitrite: NEGATIVE
Protein, ur: NEGATIVE mg/dL
Specific Gravity, Urine: 1.02 (ref 1.005–1.030)
Urobilinogen, UA: 0.2 mg/dL (ref 0.0–1.0)
pH: 7 (ref 5.0–8.0)

## 2019-12-16 NOTE — Progress Notes (Signed)
Patient reports constant dizziness/blurry vision for the past month

## 2019-12-16 NOTE — Progress Notes (Signed)
    Subjective:  Tara Romero is a 29 y.o. [phone] at [redacted]w[redacted]d being seen today for ongoing prenatal care.  She is currently monitored for the following issues for this low-risk pregnancy and has History of gestational hypertension; Supervision of high risk pregnancy, antepartum; GBS bacteriuria; and History of cesarean delivery on their problem list.  Patient reports dizziness and blurry vision for one month..  Contractions: Irritability. Vag. Bleeding: None.  Movement: Present. Denies leaking of fluid.   The following portions of the patient's history were reviewed and updated as appropriate: allergies, current medications, past family history, past medical history, past social history, past surgical history and problem list. Problem list updated.  Objective:   Vitals:   12/16/19 1131  BP: 130/78  Pulse: 84  Weight: 183 lb (83 kg)    Fetal Status: Fetal Heart Rate (bpm): 137   Movement: Present     General:  Alert, oriented and cooperative. Patient is in no acute distress.  Skin: Skin is warm and dry. No rash noted.   Cardiovascular: Normal heart rate noted  Respiratory: Normal respiratory effort, no problems with respiration noted  Abdomen: Soft, gravid, appropriate for gestational age. Pain/Pressure: Present     Pelvic:  Cervical exam deferred        Extremities: Normal range of motion.  Edema: Deep pitting, indentation remains for a short time  Mental Status: Normal mood and affect. Normal behavior. Normal judgment and thought content.   Urinalysis:      Assessment and Plan:  Pregnancy: G4P1021 at [redacted]w[redacted]d  1. Supervision of high risk pregnancy, antepartum Has had several elevated BPs in babyscripts but today in the office her BP is normal.  But has had blurry vision and dizziness.  Will check hypertension labs.  Does not have RUQ pain, does not have a severe headache and while has vision changes, these are not new this week.  Reviewed warning signs for preeclampsia and will  check labs today.  Client is scheduled for repeat C/S on 12-24-19 but need to check status of BP again on Thursday.  Message sent to admin staff as client did not schedule as leaving today.  2. Elevated BP without diagnosis of hypertension Labs done.  - Protein / creatinine ratio, urine - Comprehensive metabolic panel - CBC  3. History of cesarean delivery Has scheduled repeat C/S on 12-24-19  4. History of gestational hypertension Advised to bring her BP cuff on Thursday to compare to the cuff we have in the office.  Term labor symptoms and general obstetric precautions including but not limited to vaginal bleeding, contractions, leaking of fluid and fetal movement were reviewed in detail with the patient. Please refer to After Visit Summary for other counseling recommendations.  Return in about 2 days (around 12/18/2019) for rob with BP check.  Nolene Bernheim, RN, MSN, NP-BC Nurse Practitioner, [facility] for [employer], [facility] 12/16/2019 5:20 PM

## 2019-12-17 ENCOUNTER — Inpatient Hospital Stay (HOSPITAL_COMMUNITY)
Admission: AD | Admit: 2019-12-17 | Discharge: 2019-12-17 | Disposition: A | Discharge status: Home / Self Care | Payer: Medicaid Other | Attending: Obstetrics and Gynecology | Admitting: Obstetrics and Gynecology

## 2019-12-17 ENCOUNTER — Telehealth: Payer: Self-pay | Admitting: Lactation Services

## 2019-12-17 ENCOUNTER — Other Ambulatory Visit: Payer: Self-pay

## 2019-12-17 ENCOUNTER — Telehealth (INDEPENDENT_AMBULATORY_CARE_PROVIDER_SITE_OTHER): Payer: Medicaid Other | Admitting: Lactation Services

## 2019-12-17 ENCOUNTER — Encounter (HOSPITAL_COMMUNITY): Payer: Self-pay | Admitting: Obstetrics and Gynecology

## 2019-12-17 DIAGNOSIS — O09293 Supervision of pregnancy with other poor reproductive or obstetric history, third trimester: Secondary | ICD-10-CM

## 2019-12-17 DIAGNOSIS — Z8759 Personal history of other complications of pregnancy, childbirth and the puerperium: Secondary | ICD-10-CM | POA: Diagnosis not present

## 2019-12-17 DIAGNOSIS — Z8632 Personal history of gestational diabetes: Secondary | ICD-10-CM | POA: Diagnosis not present

## 2019-12-17 DIAGNOSIS — O26893 Other specified pregnancy related conditions, third trimester: Secondary | ICD-10-CM | POA: Diagnosis not present

## 2019-12-17 DIAGNOSIS — Z98891 History of uterine scar from previous surgery: Secondary | ICD-10-CM | POA: Diagnosis not present

## 2019-12-17 DIAGNOSIS — O99113 Other diseases of the blood and blood-forming organs and certain disorders involving the immune mechanism complicating pregnancy, third trimester: Secondary | ICD-10-CM

## 2019-12-17 DIAGNOSIS — O0990 Supervision of high risk pregnancy, unspecified, unspecified trimester: Secondary | ICD-10-CM

## 2019-12-17 DIAGNOSIS — R600 Localized edema: Secondary | ICD-10-CM

## 2019-12-17 DIAGNOSIS — Z3A38 38 weeks gestation of pregnancy: Secondary | ICD-10-CM

## 2019-12-17 DIAGNOSIS — D696 Thrombocytopenia, unspecified: Secondary | ICD-10-CM | POA: Diagnosis not present

## 2019-12-17 DIAGNOSIS — R519 Headache, unspecified: Secondary | ICD-10-CM

## 2019-12-17 DIAGNOSIS — O1203 Gestational edema, third trimester: Secondary | ICD-10-CM

## 2019-12-17 DIAGNOSIS — O10913 Unspecified pre-existing hypertension complicating pregnancy, third trimester: Secondary | ICD-10-CM | POA: Diagnosis not present

## 2019-12-17 LAB — COMPREHENSIVE METABOLIC PANEL
ALT: 25 IU/L (ref 0–32)
ALT: 26 U/L (ref 0–44)
AST: 22 IU/L (ref 0–40)
AST: 25 U/L (ref 15–41)
Albumin/Globulin Ratio: 1.5 (ref 1.2–2.2)
Albumin: 3.7 g/dL — ABNORMAL LOW (ref 3.9–5.0)
Albumin: 2.7 g/dL — ABNORMAL LOW (ref 3.5–5.0)
Alkaline Phosphatase: 101 IU/L (ref 48–121)
Alkaline Phosphatase: 84 U/L (ref 38–126)
Anion gap: 9 (ref 5–15)
BUN/Creatinine Ratio: 13 (ref 9–23)
BUN: 7 mg/dL (ref 6–20)
BUN: 7 mg/dL (ref 6–20)
Bilirubin Total: <0.2 mg/dL (ref 0.0–1.2)
CO2: 18 mmol/L — ABNORMAL LOW (ref 20–29)
CO2: 21 mmol/L — ABNORMAL LOW (ref 22–32)
Calcium: 9.1 mg/dL (ref 8.7–10.2)
Calcium: 8.7 mg/dL — ABNORMAL LOW (ref 8.9–10.3)
Chloride: 107 mmol/L — ABNORMAL HIGH (ref 96–106)
Chloride: 105 mmol/L (ref 98–111)
Creatinine, Ser: 0.54 mg/dL — ABNORMAL LOW (ref 0.57–1.00)
Creatinine, Ser: 0.52 mg/dL (ref 0.44–1.00)
GFR calc Af Amer: 149 mL/min/{1.73_m2} (ref >59)
GFR calc Af Amer: >60 mL/min (ref >60)
GFR calc non Af Amer: 129 mL/min/{1.73_m2} (ref >59)
GFR calc non Af Amer: >60 mL/min (ref >60)
Globulin, Total: 2.4 g/dL (ref 1.5–4.5)
Glucose, Bld: 90 mg/dL (ref 70–99)
Glucose: 61 mg/dL — ABNORMAL LOW (ref 65–99)
Potassium: 4.3 mmol/L (ref 3.5–5.2)
Potassium: 3.8 mmol/L (ref 3.5–5.1)
Sodium: 140 mmol/L (ref 134–144)
Sodium: 135 mmol/L (ref 135–145)
Total Bilirubin: 0.4 mg/dL (ref 0.3–1.2)
Total Protein: 6.1 g/dL (ref 6.0–8.5)
Total Protein: 6.4 g/dL — ABNORMAL LOW (ref 6.5–8.1)

## 2019-12-17 LAB — PROTEIN / CREATININE RATIO, URINE
Creatinine, Urine: 68.1 mg/dL
Creatinine, Urine: 46.29 mg/dL
Protein Creatinine Ratio: 0.17 mg/mg{Cre} — ABNORMAL HIGH (ref 0.00–0.15)
Protein, Ur: 18 mg/dL
Protein/Creat Ratio: 264 mg/g creat — ABNORMAL HIGH (ref 0–200)
Total Protein, Urine: 8 mg/dL

## 2019-12-17 LAB — CBC
HCT: 39.6 % (ref 36.0–46.0)
Hematocrit: 38.7 % (ref 34.0–46.6)
Hemoglobin: 13.2 g/dL (ref 11.1–15.9)
Hemoglobin: 13.5 g/dL (ref 12.0–15.0)
MCH: 29.9 pg (ref 26.6–33.0)
MCH: 30.2 pg (ref 26.0–34.0)
MCHC: 34.1 g/dL (ref 31.5–35.7)
MCHC: 34.1 g/dL (ref 30.0–36.0)
MCV: 88 fL (ref 79–97)
MCV: 88.6 fL (ref 80.0–100.0)
Platelets: 139 10*3/uL — ABNORMAL LOW (ref 150–450)
Platelets: 147 10*3/uL — ABNORMAL LOW (ref 150–400)
RBC: 4.42 x10E6/uL (ref 3.77–5.28)
RBC: 4.47 MIL/uL (ref 3.87–5.11)
RDW: 12.2 % (ref 11.7–15.4)
RDW: 12.2 % (ref 11.5–15.5)
WBC: 11.6 10*3/uL — ABNORMAL HIGH (ref 3.4–10.8)
WBC: 10.9 10*3/uL — ABNORMAL HIGH (ref 4.0–10.5)
nRBC: 0 % (ref 0.0–0.2)

## 2019-12-17 LAB — URINALYSIS, ROUTINE W REFLEX MICROSCOPIC
Bilirubin Urine: NEGATIVE
Glucose, UA: NEGATIVE mg/dL
Hgb urine dipstick: NEGATIVE
Ketones, ur: NEGATIVE mg/dL
Nitrite: NEGATIVE
Protein, ur: NEGATIVE mg/dL
Specific Gravity, Urine: 1.01 (ref 1.005–1.030)
pH: 7 (ref 5.0–8.0)

## 2019-12-17 MED ORDER — BUTALBITAL-APAP-CAFFEINE 50-325-40 MG PO TABS
2.0000 | ORAL_TABLET | Freq: Once | ORAL | Status: AC
  Administered 2019-12-17: 2 via ORAL
  Filled 2019-12-17: qty 2

## 2019-12-17 MED ORDER — BUTALBITAL-APAP-CAFFEINE 50-325-40 MG PO TABS: 1.0000 | ORAL_TABLET | Freq: Four times a day (QID) | ORAL | 0 refills | Status: DC | PRN

## 2019-12-17 NOTE — MAU Provider Note (Signed)
History     CSN: [id number]  Arrival date and time: 12/17/19 1246   First Provider Initiated Contact with Patient 12/17/19 1342      Chief Complaint  Patient presents with  . Hypertension  . Headache   HPI  Tara Romero is a 29 year old female 38 weeks 1 day pregnant here in MAU with elevated blood pressures at home and headache x1 month.  States she was in the office yesterday and had normal blood pressures, however due to her report of elevated pressures at home labs were collected in the office for [facility] work-up.  She reports recently 3 days of persistent headaches. Taking tylenol 2 x per day which helps for only 1 hour and then comes back. She is taking extra strength 500 mg only at a time.  History of HTN with previous pregnancy, unknown about pre E,  with previous pregnancy with cesarean section. She Is not taking ASA.  Currently rates her HA pain 6/10- temporal headache. The last time she took tylenol was this morning; 500 mg.  Also reports some changes in her vision at times she sees spots other time it is blurred this is not a new problem, this has been present for over a month.  Patient reports BP at home 387 & 564 systolic.   OB History    Gravida  4   Para  1   Term  1   Preterm      AB  2   Living  1     SAB  2   TAB      Ectopic      Multiple  0   Live Births  1           Past Medical History:  Diagnosis Date  . Gestational diabetes   . Missed ab 05/29/2018    Past Surgical History:  Procedure Laterality Date  . CESAREAN SECTION N/A 04/08/2015   Procedure: CESAREAN SECTION;  Surgeon: Malachy Mood, MD;  Location: [facility] ORS;  Service: Obstetrics;  Laterality: N/A;  . DILATION AND EVACUATION N/A 06/25/2018   Procedure: DILATATION AND EVACUATION;  Surgeon: Gae Dry, MD;  Location: [facility] ORS;  Service: Gynecology;  Laterality: N/A;    History reviewed. No pertinent family history.  Social History   Tobacco Use  . Smoking status: Never  Smoker  . Smokeless tobacco: Never Used  Substance Use Topics  . Alcohol use: No  . Drug use: No    Allergies: No Known Allergies  Medications Prior to Admission  Medication Sig Dispense Refill Last Dose  . Blood Pressure Monitoring (BLOOD PRESSURE KIT) DEVI 1 Device by Does not apply route as needed. 1 Device 0 12/17/2019 at Unknown time  . famotidine (PEPCID) 20 MG tablet Take 1 tablet (20 mg total) by mouth 2 (two) times daily. (Patient taking differently: Take 20 mg by mouth daily at 12 noon. ) 60 tablet 0 12/16/2019 at Unknown time  . prenatal vitamin w/FE, FA (PRENATAL 1 + 1) 27-1 MG TABS tablet Take 1 tablet by mouth daily at 12 noon. 30 tablet 4 12/16/2019 at Unknown time   Results for orders placed or performed during the hospital encounter of 12/17/19 (from the past 48 hour(s))  Urinalysis, Routine w reflex microscopic     Status: Abnormal   Collection Time: 12/17/19  1:16 PM  Result Value Ref Range   Color, Urine YELLOW YELLOW   APPearance CLOUDY (A) CLEAR   Specific Gravity, Urine 1.010 1.005 - 1.030  pH 7.0 5.0 - 8.0   Glucose, UA NEGATIVE NEGATIVE mg/dL   Hgb urine dipstick NEGATIVE NEGATIVE   Bilirubin Urine NEGATIVE NEGATIVE   Ketones, ur NEGATIVE NEGATIVE mg/dL   Protein, ur NEGATIVE NEGATIVE mg/dL   Nitrite NEGATIVE NEGATIVE   Leukocytes,Ua LARGE (A) NEGATIVE   RBC / HPF 0-5 0 - 5 RBC/hpf   WBC, UA 6-10 0 - 5 WBC/hpf   Bacteria, UA MANY (A) NONE SEEN   Squamous Epithelial / LPF 11-20 0 - 5   Mucus PRESENT     Comment: Performed at [facility], [place] [address], [place], [place] [address]  CBC     Status: Abnormal   Collection Time: 12/17/19  1:57 PM  Result Value Ref Range   WBC 10.9 (H) 4.0 - 10.5 K/uL   RBC 4.47 3.87 - 5.11 MIL/uL   Hemoglobin 13.5 12.0 - 15.0 g/dL   HCT 39.6 36.0 - 46.0 %   MCV 88.6 80.0 - 100.0 fL   MCH 30.2 26.0 - 34.0 pg   MCHC 34.1 30.0 - 36.0 g/dL   RDW 12.2 11.5 - 15.5 %   Platelets 147 (L) 150 - 400 K/uL    Comment:  REPEATED TO VERIFY   nRBC 0.0 0.0 - 0.2 %    Comment: Performed at [facility], [place] [address] [place], [place] [address]  Comprehensive metabolic panel     Status: Abnormal   Collection Time: 12/17/19  1:57 PM  Result Value Ref Range   Sodium 135 135 - 145 mmol/L   Potassium 3.8 3.5 - 5.1 mmol/L   Chloride 105 98 - 111 mmol/L   CO2 21 (L) 22 - 32 mmol/L   Glucose, Bld 90 70 - 99 mg/dL    Comment: Glucose reference range applies only to samples taken after fasting for at least 8 hours.   BUN 7 6 - 20 mg/dL   Creatinine, Ser 0.52 0.44 - 1.00 mg/dL   Calcium 8.7 (L) 8.9 - 10.3 mg/dL   Total Protein 6.4 (L) 6.5 - 8.1 g/dL   Albumin 2.7 (L) 3.5 - 5.0 g/dL   AST 25 15 - 41 U/L   ALT 26 0 - 44 U/L   Alkaline Phosphatase 84 38 - 126 U/L   Total Bilirubin 0.4 0.3 - 1.2 mg/dL   GFR calc non Af Amer >60 >60 mL/min   GFR calc Af Amer >60 >60 mL/min   Anion gap 9 5 - 15    Comment: Performed at [facility] [address] [place], [place] [address]  Protein / creatinine ratio, urine     Status: Abnormal   Collection Time: 12/17/19  2:50 PM  Result Value Ref Range   Creatinine, Urine 46.29 mg/dL   Total Protein, Urine 8 mg/dL    Comment: NO NORMAL RANGE ESTABLISHED FOR THIS TEST   Protein Creatinine Ratio 0.17 (H) 0.00 - 0.15 mg/mg[Cre]    Comment: Performed at [facility] [address], [place], [place] [address]   Review of Systems  Eyes: Positive for photophobia (1 month of visual changes. Spots only if she is standing for a while. ) and visual disturbance.  Cardiovascular: Positive for leg swelling.  Neurological: Positive for headaches.   Physical Exam   Blood pressure 138/87, pulse 88, temperature 98.6 F (37 C), resp. rate 16, weight 83.9 kg, last menstrual period 03/25/2019, SpO2 100 %, unknown if currently breastfeeding.   Patient Vitals for the past  24 hrs:  BP Temp Pulse Resp SpO2 Weight  12/17/19 1637 135/76 -- 89 16 -- --  12/17/19 1516 125/79  -- -- -- -- --  12/17/19 1501 119/81 -- -- -- -- --  12/17/19 1446 107/88 -- -- -- -- --  12/17/19 1351 139/82 -- 88 -- -- --  12/17/19 1331 128/86 -- 85 -- -- --  12/17/19 1323 -- -- -- -- 100 % --  12/17/19 1322 138/87 98.6 F (37 C) 88 16 -- 83.9 kg  12/17/19 1320 138/87 -- 87 -- -- --    Physical Exam  Constitutional: She is oriented to person, place, and time. She appears well-developed and well-nourished. No distress.  HENT:  Head: Normocephalic.  Eyes: Pupils are equal, round, and reactive to light.  Musculoskeletal:        General: Normal range of motion.  Neurological: She is alert and oriented to person, place, and time. She displays abnormal reflex.  Reflex Scores:      Patellar reflexes are 3+ on the right side and 3+ on the left side. No clonus   Skin: Skin is warm. She is not diaphoretic.  Psychiatric: Her behavior is normal.   Fetal Tracing: Baseline: 140 bpm Variability: Moderate  Accelerations: 15x15 Decelerations: None Toco: Occasional   MAU Course  Procedures  None  MDM  Platelets 200 on 10/20/19 Today platelets: 147, platelets 5/25: 139 BP WNL today in MAU Discussed HPI, labs, BP's with Dr. Rosana Hoes. Fioricet given in MAU- patient requesting to leave following administration. She is scheduled for BP check tomorrow and repeat C-section on 6/1. Patient does not meet criteria for GHTN- BP's normal in MAU. Patient encouraged to bring home cuff to office tomorrow for appt.   Assessment and Plan   A:  1. Headache in pregnancy, antepartum, third trimester   2. [redacted] weeks gestation of pregnancy   3. Thrombocytopenia affecting pregnancy ([place])   4. Bilateral lower extremity edema   5. History of gestational hypertension     P:  Discharge home in stable condition Rx: Fioricet  Keep your BP check tomorrow  Return to MAU if symptoms worsen  Preeclampsia precautions  Elevate legs Increase oral fluid intake   Riah Kehoe, Artist Pais, NP 12/17/2019 8:22  PM

## 2019-12-17 NOTE — MAU Note (Signed)
.   Tara Romero is a 29 y.o. at [redacted]w[redacted]d here in MAU reporting: she was told to come in and be evaluated. PT states she had an appointment yesterday but is still having headaches with blurred vision. Denies any other pain at present LMP:   Onset of complaint: ongoing Pain score: 7 Vitals:   12/17/19 1322 12/17/19 1323  BP: 138/87   Pulse: 88   Resp: 16   Temp: 98.6 F (37 C)   SpO2:  100%     FHT:137 Lab orders placed from triage: UA

## 2019-12-17 NOTE — Telephone Encounter (Signed)
Attempted to call patient to inform her to go to MAU for evaluation. Patient did not answer. Advised her to check her My Chart message and call the office if she has questions or concerns. My Chart message sent to report to MAU.

## 2019-12-17 NOTE — Telephone Encounter (Signed)
Patient called back. She was advised to go to MAU for evaluation. She is planning to go there in the next 30 minutes. Called MAU and spoke with Joni Reining, she was informed patient will be arriving to be evaluated.

## 2019-12-18 ENCOUNTER — Telehealth: Payer: Self-pay | Admitting: Obstetrics and Gynecology

## 2019-12-18 ENCOUNTER — Encounter (HOSPITAL_COMMUNITY): Payer: Self-pay | Admitting: Obstetrics and Gynecology

## 2019-12-18 ENCOUNTER — Other Ambulatory Visit: Payer: Self-pay

## 2019-12-18 ENCOUNTER — Telehealth: Payer: Medicaid Other

## 2019-12-18 ENCOUNTER — Inpatient Hospital Stay (HOSPITAL_COMMUNITY)
Admission: AD | Admit: 2019-12-18 | Discharge: 2019-12-18 | Disposition: A | Discharge status: Home / Self Care | Payer: Medicaid Other | Attending: Obstetrics and Gynecology | Admitting: Obstetrics and Gynecology

## 2019-12-18 DIAGNOSIS — Z3689 Encounter for other specified antenatal screening: Secondary | ICD-10-CM

## 2019-12-18 DIAGNOSIS — O26893 Other specified pregnancy related conditions, third trimester: Secondary | ICD-10-CM | POA: Diagnosis not present

## 2019-12-18 DIAGNOSIS — R519 Headache, unspecified: Secondary | ICD-10-CM | POA: Diagnosis not present

## 2019-12-18 DIAGNOSIS — O10913 Unspecified pre-existing hypertension complicating pregnancy, third trimester: Secondary | ICD-10-CM | POA: Insufficient documentation

## 2019-12-18 DIAGNOSIS — Z8632 Personal history of gestational diabetes: Secondary | ICD-10-CM | POA: Diagnosis not present

## 2019-12-18 DIAGNOSIS — Z8759 Personal history of other complications of pregnancy, childbirth and the puerperium: Secondary | ICD-10-CM

## 2019-12-18 DIAGNOSIS — Z3A38 38 weeks gestation of pregnancy: Secondary | ICD-10-CM

## 2019-12-18 LAB — COMPREHENSIVE METABOLIC PANEL
ALT: 26 U/L (ref 0–44)
AST: 24 U/L (ref 15–41)
Albumin: 2.8 g/dL — ABNORMAL LOW (ref 3.5–5.0)
Alkaline Phosphatase: 84 U/L (ref 38–126)
Anion gap: 12 (ref 5–15)
BUN: 6 mg/dL (ref 6–20)
CO2: 21 mmol/L — ABNORMAL LOW (ref 22–32)
Calcium: 8.8 mg/dL — ABNORMAL LOW (ref 8.9–10.3)
Chloride: 104 mmol/L (ref 98–111)
Creatinine, Ser: 0.57 mg/dL (ref 0.44–1.00)
GFR calc Af Amer: >60 mL/min (ref >60)
GFR calc non Af Amer: >60 mL/min (ref >60)
Glucose, Bld: 79 mg/dL (ref 70–99)
Potassium: 3.9 mmol/L (ref 3.5–5.1)
Sodium: 137 mmol/L (ref 135–145)
Total Bilirubin: 0.4 mg/dL (ref 0.3–1.2)
Total Protein: 6.4 g/dL — ABNORMAL LOW (ref 6.5–8.1)

## 2019-12-18 LAB — CBC
HCT: 39.8 % (ref 36.0–46.0)
Hemoglobin: 13.7 g/dL (ref 12.0–15.0)
MCH: 30.2 pg (ref 26.0–34.0)
MCHC: 34.4 g/dL (ref 30.0–36.0)
MCV: 87.9 fL (ref 80.0–100.0)
Platelets: 154 10*3/uL (ref 150–400)
RBC: 4.53 MIL/uL (ref 3.87–5.11)
RDW: 12.2 % (ref 11.5–15.5)
WBC: 14.1 10*3/uL — ABNORMAL HIGH (ref 4.0–10.5)
nRBC: 0 % (ref 0.0–0.2)

## 2019-12-18 LAB — PROTEIN / CREATININE RATIO, URINE
Creatinine, Urine: 66 mg/dL
Protein Creatinine Ratio: 0.11 mg/mg{Cre} (ref 0.00–0.15)
Total Protein, Urine: 7 mg/dL

## 2019-12-18 MED ORDER — ACETAMINOPHEN 500 MG PO TABS
ORAL_TABLET | ORAL | Status: AC
  Administered 2019-12-18: 1000 mg via ORAL
  Filled 2019-12-18: qty 2

## 2019-12-18 MED ORDER — METOCLOPRAMIDE HCL 10 MG PO TABS
ORAL_TABLET | ORAL | Status: AC
  Administered 2019-12-18: 10 mg via ORAL
  Filled 2019-12-18: qty 1

## 2019-12-18 MED ORDER — CYCLOBENZAPRINE HCL 5 MG PO TABS
10.0000 mg | ORAL_TABLET | Freq: Once | ORAL | Status: AC
  Administered 2019-12-18: 10 mg via ORAL
  Filled 2019-12-18: qty 2

## 2019-12-18 MED ORDER — ACETAMINOPHEN 500 MG PO TABS: 1000.0000 mg | ORAL_TABLET | Freq: Once | ORAL | Status: AC

## 2019-12-18 MED ORDER — METOCLOPRAMIDE HCL 10 MG PO TABS: 10.0000 mg | ORAL_TABLET | Freq: Once | ORAL | Status: AC

## 2019-12-18 NOTE — MAU Provider Note (Signed)
History     CSN: [id number]  Arrival date and time: 12/18/19 1007   First Provider Initiated Contact with Patient 12/18/19 1129      Chief Complaint  Patient presents with  . Hypertension   [name] is a 29 y.o. [phone] at 17w2dwho presents to MAU for preeclampsia evaluation after she had elevated BP of 146/83 this morning at home. Patient was in MAU last night and was treated with Fioricet for a headache and Fioricet resolved the headache. Patient rates HA as 4/10. Patient denies HA issues including migraines, frequent headaches, or other HA diagnosis. Patient also endorses blurred vision at this time and states it has been constant for the past month. Patient also endorses SOB constantly, even at rest for past several weeks.  Patient has only had elevated BP using home BP cuff and has not had cuff checked for fit or function by clinic.  Pt denies seeing spots, N/V, epigastric pain, swelling in face and hands, sudden weight gain. Pt denies chest pain.  Pt denies constipation, diarrhea, or urinary problems. Pt denies fever, chills, fatigue, sweating or changes in appetite. Pt denies dizziness, light-headedness, weakness.  Pt denies VB, ctx, LOF and reports good FM.  Current pregnancy problems? Hx C/S x1, hx gHTN Blood Type? AB Positive Allergies? NKDA Current medications? Famotidine, PNVs Current PNC & next appt? [facility] 330PM today   OB History    Gravida  4   Para  1   Term  1   Preterm      AB  2   Living  1     SAB  2   TAB      Ectopic      Multiple  0   Live Births  1           Past Medical History:  Diagnosis Date  . Gestational diabetes   . Missed ab 05/29/2018    Past Surgical History:  Procedure Laterality Date  . CESAREAN SECTION N/A 04/08/2015   Procedure: CESAREAN SECTION;  Surgeon: AMalachy Mood MD;  Location: [facility] ORS;  Service: Obstetrics;  Laterality: N/A;  . DILATION AND EVACUATION N/A 06/25/2018   Procedure:  DILATATION AND EVACUATION;  Surgeon: HGae Dry MD;  Location: [facility] ORS;  Service: Gynecology;  Laterality: N/A;    No family history on file.  Social History   Tobacco Use  . Smoking status: Never Smoker  . Smokeless tobacco: Never Used  Substance Use Topics  . Alcohol use: No  . Drug use: No    Allergies: No Known Allergies  Medications Prior to Admission  Medication Sig Dispense Refill Last Dose  . Blood Pressure Monitoring (BLOOD PRESSURE KIT) DEVI 1 Device by Does not apply route as needed. 1 Device 0 12/18/2019 at Unknown time  . famotidine (PEPCID) 20 MG tablet Take 1 tablet (20 mg total) by mouth 2 (two) times daily. (Patient taking differently: Take 20 mg by mouth daily at 12 noon. ) 60 tablet 0 12/17/2019 at Unknown time  . prenatal vitamin w/FE, FA (PRENATAL 1 + 1) 27-1 MG TABS tablet Take 1 tablet by mouth daily at 12 noon. 30 tablet 4 12/17/2019 at Unknown time  . butalbital-acetaminophen-caffeine (FIORICET) 50-325-40 MG tablet Take 1-2 tablets by mouth every 6 (six) hours as needed for headache. 20 tablet 0 has not started taking    Review of Systems  Constitutional: Negative for chills, diaphoresis, fatigue and fever.  Eyes: Negative for visual disturbance.  Respiratory:  Negative for shortness of breath.   Cardiovascular: Negative for chest pain.  Gastrointestinal: Negative for abdominal pain, constipation, diarrhea, nausea and vomiting.  Genitourinary: Negative for dysuria, flank pain, frequency, pelvic pain, urgency, vaginal bleeding and vaginal discharge.  Neurological: Positive for headaches. Negative for dizziness, weakness and light-headedness.   Physical Exam   Blood pressure 128/79, pulse 75, last menstrual period 03/25/2019, SpO2 99 %, unknown if currently breastfeeding.  Patient Vitals for the past 24 hrs:  BP Pulse SpO2  12/18/19 1426 128/79 75 --  12/18/19 1210 -- -- 99 %  12/18/19 1205 -- -- 99 %  12/18/19 1200 114/73 86 99 %  12/18/19 1155  -- -- 98 %  12/18/19 1150 -- -- 98 %  12/18/19 1146 130/71 91 --  12/18/19 1130 124/85 92 --  12/18/19 1120 -- -- 97 %  12/18/19 1115 117/90 81 98 %  12/18/19 1110 -- -- 98 %  12/18/19 1105 -- -- 98 %  12/18/19 1100 127/84 94 98 %  12/18/19 1055 -- -- 98 %  12/18/19 1050 -- -- 98 %  12/18/19 1046 123/74 100 --  12/18/19 1045 -- -- 99 %  12/18/19 1040 -- -- 99 %  12/18/19 1035 -- -- 99 %  12/18/19 1030 -- -- 99 %  12/18/19 1028 -- -- 100 %  12/18/19 1027 129/86 (!) 101 --   Physical Exam  Constitutional: She is oriented to person, place, and time. She appears well-developed and well-nourished. No distress.  HENT:  Head: Normocephalic and atraumatic.  Cardiovascular: Normal rate and regular rhythm.  Respiratory: Effort normal and breath sounds normal.  Neurological: She is alert and oriented to person, place, and time.  Skin: She is not diaphoretic.  Psychiatric: She has a normal mood and affect. Her behavior is normal. Judgment and thought content normal.   Results for orders placed or performed during the hospital encounter of 12/18/19 (from the past 24 hour(s))  Protein / creatinine ratio, urine     Status: None   Collection Time: 12/18/19  2:53 PM  Result Value Ref Range   Creatinine, Urine 66.00 mg/dL   Total Protein, Urine 7 mg/dL   Protein Creatinine Ratio 0.11 0.00 - 0.15 mg/mg[Cre]  CBC     Status: Abnormal   Collection Time: 12/18/19  3:06 PM  Result Value Ref Range   WBC 14.1 (H) 4.0 - 10.5 K/uL   RBC 4.53 3.87 - 5.11 MIL/uL   Hemoglobin 13.7 12.0 - 15.0 g/dL   HCT 39.8 36.0 - 46.0 %   MCV 87.9 80.0 - 100.0 fL   MCH 30.2 26.0 - 34.0 pg   MCHC 34.4 30.0 - 36.0 g/dL   RDW 12.2 11.5 - 15.5 %   Platelets 154 150 - 400 K/uL   nRBC 0.0 0.0 - 0.2 %  Comprehensive metabolic panel     Status: Abnormal   Collection Time: 12/18/19  3:06 PM  Result Value Ref Range   Sodium 137 135 - 145 mmol/L   Potassium 3.9 3.5 - 5.1 mmol/L   Chloride 104 98 - 111 mmol/L   CO2 21  (L) 22 - 32 mmol/L   Glucose, Bld 79 70 - 99 mg/dL   BUN 6 6 - 20 mg/dL   Creatinine, Ser 0.57 0.44 - 1.00 mg/dL   Calcium 8.8 (L) 8.9 - 10.3 mg/dL   Total Protein 6.4 (L) 6.5 - 8.1 g/dL   Albumin 2.8 (L) 3.5 - 5.0 g/dL   AST 24 15 -  41 U/L   ALT 26 0 - 44 U/L   Alkaline Phosphatase 84 38 - 126 U/L   Total Bilirubin 0.4 0.3 - 1.2 mg/dL   GFR calc non Af Amer >60 >60 mL/min   GFR calc Af Amer >60 >60 mL/min   Anion gap 12 5 - 15    MAU Course  Procedures  MDM -preeclampsia evaluation without severe range BP in MAU on admission -elevated BP of none in MAU; patient has only had elevated BP using home BP cuff and has not had cuff checked for fit or function by clinic. -symptoms include: HA, blurred vision, SOB; Tylenol and Reglan given given -lungs clear to auscultation, O2 97% - 100% -UA: cloudy/lg leuks/many bacteria (yesterday), sending urine for culture today -CBC: H/H 13.7/39.8, platelets 154 -CMP: serum creatinine 0.57, AST/ALT 24/26 -PCr: 0.11 -EFM: reactive       -baseline: 140       -variability: moderate       -accels: present, 15x15       -decels: absent       -TOCO: irritability -after medication administration, pt reports HA now unchanged at 4/10 -consulted with Dr. Rosana Hoes, will give Flexeril for HA and can discharge to home with a BP check tomorrow with normal labs -after Flexeril, pt reports HA now resolved -pt discharged to home in stable condition  Orders Placed This Encounter  Procedures  . Culture, OB Urine    Standing Status:   Standing    Number of Occurrences:   1  . CBC    Standing Status:   Standing    Number of Occurrences:   1  . Comprehensive metabolic panel    Standing Status:   Standing    Number of Occurrences:   1  . Protein / creatinine ratio, urine    Standing Status:   Standing    Number of Occurrences:   1  . Diet regular Room service appropriate? Yes; Fluid consistency: Thin    Standing Status:   Standing    Number of Occurrences:    1    Order Specific Question:   Room service appropriate?    Answer:   Yes    Order Specific Question:   Fluid consistency:    Answer:   Thin  . Discharge patient    Order Specific Question:   Discharge disposition    Answer:   01-Home or Self Care [1]    Order Specific Question:   Discharge patient date    Answer:   12/18/2019   Meds ordered this encounter  Medications  . acetaminophen (TYLENOL) 500 MG tablet    Morris, Ginger   : cabinet override  . metoCLOPramide (REGLAN) 10 MG tablet    Morris, Ginger   : cabinet override  . acetaminophen (TYLENOL) tablet 1,000 mg  . metoCLOPramide (REGLAN) tablet 10 mg  . cyclobenzaprine (FLEXERIL) tablet 10 mg   Assessment and Plan   1. Pregnancy headache in third trimester   2. [redacted] weeks gestation of pregnancy   3. NST (non-stress test) reactive   4. History of gestational hypertension    Allergies as of 12/18/2019   No Known Allergies     Medication List    TAKE these medications   Blood Pressure Kit Devi 1 Device by Does not apply route as needed.   butalbital-acetaminophen-caffeine 50-325-40 MG tablet Commonly known as: FIORICET Take 1-2 tablets by mouth every 6 (six) hours as needed for headache.   famotidine 20 MG  tablet Commonly known as: Pepcid Take 1 tablet (20 mg total) by mouth 2 (two) times daily. What changed: when to take this   prenatal vitamin w/FE, FA 27-1 MG Tabs tablet Take 1 tablet by mouth daily at 12 noon.      -will call with culture results, if positive -pt to pick up Fioricet prescription tonight -message sent to [facility] to schedule for BP check tomorrow, pt aware to bring BP cuff to check for fit/function to visit tomorrow and to call office at [place] if not hearing from them sooner -preeclampsia precautions given -return MAU precautions given -pt discharged to home in stable condition  Elmyra Ricks E Azel Gumina 12/18/2019, 4:48 PM

## 2019-12-18 NOTE — Telephone Encounter (Signed)
Babyscripts called to report an elevated BP 146/86 Per Dr. Earlene Plater, patient to return this afternoon for a BP check as scheduled.  Duane Lope, NP 12/18/2019 9:54 AM

## 2019-12-18 NOTE — MAU Note (Signed)
.  Tara Romero is a 29 y.o. at [redacted]w[redacted]d here in MAU reporting: she was told to come in if her B/P was elevated again today. PT was evaluated in MAU yesterday and given fioricet for headache and states her headache did go away, was given a prescription to pick up and states she has not picked it up yet. No other complaints  Onset of complaint:ongoing Pain score: 4 There were no vitals filed for this visit.   FHT:145 Lab orders placed from triage: UA

## 2019-12-18 NOTE — Discharge Instructions (Signed)
Signs and Symptoms of Labor Labor is your body's natural process of moving your baby, placenta, and umbilical cord out of your uterus. The process of labor usually starts when your baby is full-term, between 37 and 40 weeks of pregnancy. How will I know when I am close to going into labor? As your body prepares for labor and the birth of your baby, you may notice the following symptoms in the weeks and days before true labor starts:  Having a strong desire to get your home ready to receive your new baby. This is called nesting. Nesting may be a sign that labor is approaching, and it may occur several weeks before birth. Nesting may involve cleaning and organizing your home.  Passing a small amount of thick, bloody mucus out of your vagina (normal bloody show or losing your mucus plug). This may happen more than a week before labor begins, or it might occur right before labor begins as the opening of the cervix starts to widen (dilate). For some women, the entire mucus plug passes at once. For others, smaller portions of the mucus plug may gradually pass over several days.  Your baby moving (dropping) lower in your pelvis to get into position for birth (lightening). When this happens, you may feel more pressure on your bladder and pelvic bone and less pressure on your ribs. This may make it easier to breathe. It may also cause you to need to urinate more often and have problems with bowel movements.  Having "practice contractions" (Braxton Hicks contractions) that occur at irregular (unevenly spaced) intervals that are more than 10 minutes apart. This is also called false labor. False labor contractions are common after exercise or sexual activity, and they will stop if you change position, rest, or drink fluids. These contractions are usually mild and do not get stronger over time. They may feel like: ? A backache or back pain. ? Mild cramps, similar to menstrual cramps. ? Tightening or pressure in  your abdomen. Other early symptoms that labor may be starting soon include:  Nausea or loss of appetite.  Diarrhea.  Having a sudden burst of energy, or feeling very tired.  Mood changes.  Having trouble sleeping. How will I know when labor has begun? Signs that true labor has begun may include:  Having contractions that come at regular (evenly spaced) intervals and increase in intensity. This may feel like more intense tightening or pressure in your abdomen that moves to your back. ? Contractions may also feel like rhythmic pain in your upper thighs or back that comes and goes at regular intervals. ? For first-time mothers, this change in intensity of contractions often occurs at a more gradual pace. ? Women who have given birth before may notice a more rapid progression of contraction changes.  Having a feeling of pressure in the vaginal area.  Your water breaking (rupture of membranes). This is when the sac of fluid that surrounds your baby breaks. When this happens, you will notice fluid leaking from your vagina. This may be clear or blood-tinged. Labor usually starts within 24 hours of your water breaking, but it may take longer to begin. ? Some women notice this as a gush of fluid. ? Others notice that their underwear repeatedly becomes damp. Follow these instructions at home:   When labor starts, or if your water breaks, call your health care provider or nurse care line. Based on your situation, they will determine when you should go in for an   exam.  When you are in early labor, you may be able to rest and manage symptoms at home. Some strategies to try at home include: ? Breathing and relaxation techniques. ? Taking a warm bath or shower. ? Listening to music. ? Using a heating pad on the lower back for pain. If you are directed to use heat:  Place a towel between your skin and the heat source.  Leave the heat on for 20-30 minutes.  Remove the heat if your skin turns  bright red. This is especially important if you are unable to feel pain, heat, or cold. You may have a greater risk of getting burned. Get help right away if:  You have painful, regular contractions that are 5 minutes apart or less.  Labor starts before you are [redacted] weeks along in your pregnancy.  You have a fever.  You have a headache that does not go away.  You have bright red blood coming from your vagina.  You do not feel your baby moving.  You have a sudden onset of: ? Severe headache with vision problems. ? Nausea, vomiting, or diarrhea. ? Chest pain or shortness of breath. These symptoms may be an emergency. If your health care provider recommends that you go to the hospital or birth center where you plan to deliver, do not drive yourself. Have someone else drive you, or call emergency services (911 in the [place] Summary  Labor is your body's natural process of moving your baby, placenta, and umbilical cord out of your uterus.  The process of labor usually starts when your baby is full-term, between 37 and 40 weeks of pregnancy.  When labor starts, or if your water breaks, call your health care provider or nurse care line. Based on your situation, they will determine when you should go in for an exam. This information is not intended to replace advice given to you by your health care provider. Make sure you discuss any questions you have with your health care provider. Document Revised: 04/09/2017 Document Reviewed: 12/15/2016 Elsevier Patient Education  2020 [employer]      Preeclampsia and Eclampsia Preeclampsia is a serious condition that may develop during pregnancy. This condition causes high blood pressure and increased protein in your urine along with other symptoms, such as headaches and vision changes. These symptoms may develop as the condition gets worse. Preeclampsia may occur at 20 weeks of pregnancy or later. Diagnosing and treating preeclampsia early is  very important. If not treated early, it can cause serious problems for you and your baby. One problem it can lead to is eclampsia. Eclampsia is a condition that causes muscle jerking or shaking (convulsions or seizures) and other serious problems for the mother. During pregnancy, delivering your baby may be the best treatment for preeclampsia or eclampsia. For most women, preeclampsia and eclampsia symptoms go away after giving birth. In rare cases, a woman may develop preeclampsia after giving birth (postpartum preeclampsia). This usually occurs within 48 hours after childbirth but may occur up to 6 weeks after giving birth. What are the causes? The cause of preeclampsia is not known. What increases the risk? The following risk factors make you more likely to develop preeclampsia:  Being pregnant for the first time.  Having had preeclampsia during a past pregnancy.  Having a family history of preeclampsia.  Having high blood pressure.  Being pregnant with more than one baby.  Being 35 or older.  Being African-American.  Having kidney disease or diabetes.    Having medical conditions such as lupus or blood diseases.  Being very overweight (obese). What are the signs or symptoms? The most common symptoms are:  Severe headaches.  Vision problems, such as blurred or double vision.  Abdominal pain, especially upper abdominal pain. Other symptoms that may develop as the condition gets worse include:  Sudden weight gain.  Sudden swelling of the hands, face, legs, and feet.  Severe nausea and vomiting.  Numbness in the face, arms, legs, and feet.  Dizziness.  Urinating less than usual.  Slurred speech.  Convulsions or seizures. How is this diagnosed? There are no screening tests for preeclampsia. Your health care provider will ask you about symptoms and check for signs of preeclampsia during your prenatal visits. You may also have tests that include:  Checking your blood  pressure.  Urine tests to check for protein. Your health care provider will check for this at every prenatal visit.  Blood tests.  Monitoring your baby's heart rate.  Ultrasound. How is this treated? You and your health care provider will determine the treatment approach that is best for you. Treatment may include:  Having more frequent prenatal exams to check for signs of preeclampsia, if you have an increased risk for preeclampsia.  Medicine to lower your blood pressure.  Staying in the hospital, if your condition is severe. There, treatment will focus on controlling your blood pressure and the amount of fluids in your body (fluid retention).  Taking medicine (magnesium sulfate) to prevent seizures. This may be given as an injection or through an IV.  Taking a low-dose aspirin during your pregnancy.  Delivering your baby early. You may have your labor started with medicine (induced), or you may have a cesarean delivery. Follow these instructions at home: Eating and drinking   Drink enough fluid to keep your urine pale yellow.  Avoid caffeine. Lifestyle  Do not use any products that contain nicotine or tobacco, such as cigarettes and e-cigarettes. If you need help quitting, ask your health care provider.  Do not use alcohol or drugs.  Avoid stress as much as possible. Rest and get plenty of sleep. General instructions  Take over-the-counter and prescription medicines only as told by your health care provider.  When lying down, lie on your left side. This keeps pressure off your major blood vessels.  When sitting or lying down, raise (elevate) your feet. Try putting some pillows underneath your lower legs.  Exercise regularly. Ask your health care provider what kinds of exercise are best for you.  Keep all follow-up and prenatal visits as told by your health care provider. This is important. How is this prevented? There is no known way of preventing preeclampsia or  eclampsia from developing. However, to lower your risk of complications and detect problems early:  Get regular prenatal care. Your health care provider may be able to diagnose and treat the condition early.  Maintain a healthy weight. Ask your health care provider for help managing weight gain during pregnancy.  Work with your health care provider to manage any long-term (chronic) health conditions you have, such as diabetes or kidney problems.  You may have tests of your blood pressure and kidney function after giving birth.  Your health care provider may have you take low-dose aspirin during your next pregnancy. Contact a health care provider if:  You have symptoms that your health care provider told you may require more treatment or monitoring, such as: ? Headaches. ? Nausea or vomiting. ? Abdominal pain. ?   Dizziness. ? Light-headedness. Get help right away if:  You have severe: ? Abdominal pain. ? Headaches that do not get better. ? Dizziness. ? Vision problems. ? Confusion. ? Nausea or vomiting.  You have any of the following: ? A seizure. ? Sudden, rapid weight gain. ? Sudden swelling in your hands, ankles, or face. ? Trouble moving any part of your body. ? Numbness in any part of your body. ? Trouble speaking. ? Abnormal bleeding.  You faint. Summary  Preeclampsia is a serious condition that may develop during pregnancy.  This condition causes high blood pressure and increased protein in your urine along with other symptoms, such as headaches and vision changes.  Diagnosing and treating preeclampsia early is very important. If not treated early, it can cause serious problems for you and your baby.  Get help right away if you have symptoms that your health care provider told you to watch for. This information is not intended to replace advice given to you by your health care provider. Make sure you discuss any questions you have with your health care  provider. Document Revised: 03/12/2018 Document Reviewed: 02/14/2016 Elsevier Patient Education  2020 Elsevier Inc.  

## 2019-12-20 LAB — CULTURE, OB URINE: Culture: >=100000 — AB

## 2019-12-22 ENCOUNTER — Ambulatory Visit (HOSPITAL_COMMUNITY)
Admission: RE | Admit: 2019-12-22 | Discharge: 2019-12-22 | Disposition: A | Discharge status: Home / Self Care | Payer: Medicaid Other | Source: Ambulatory Visit | Attending: Obstetrics and Gynecology | Admitting: Obstetrics and Gynecology

## 2019-12-22 ENCOUNTER — Other Ambulatory Visit: Payer: Self-pay

## 2019-12-22 DIAGNOSIS — Z01812 Encounter for preprocedural laboratory examination: Secondary | ICD-10-CM | POA: Insufficient documentation

## 2019-12-22 DIAGNOSIS — Z20822 Contact with and (suspected) exposure to covid-19: Secondary | ICD-10-CM | POA: Diagnosis not present

## 2019-12-22 LAB — TYPE AND SCREEN
ABO/RH(D): AB POS
Antibody Screen: NEGATIVE

## 2019-12-22 LAB — ABO/RH: ABO/RH(D): AB POS

## 2019-12-22 LAB — CBC
HCT: 39.8 % (ref 36.0–46.0)
Hemoglobin: 13.8 g/dL (ref 12.0–15.0)
MCH: 31 pg (ref 26.0–34.0)
MCHC: 34.7 g/dL (ref 30.0–36.0)
MCV: 89.4 fL (ref 80.0–100.0)
Platelets: 133 10*3/uL — ABNORMAL LOW (ref 150–400)
RBC: 4.45 MIL/uL (ref 3.87–5.11)
RDW: 12.4 % (ref 11.5–15.5)
WBC: 11.2 10*3/uL — ABNORMAL HIGH (ref 4.0–10.5)
nRBC: 0 % (ref 0.0–0.2)

## 2019-12-22 LAB — SARS CORONAVIRUS 2 (TAT 6-24 HRS): SARS Coronavirus 2: NEGATIVE

## 2019-12-22 NOTE — MAU Note (Signed)
Asymptomatic, swab collected. Waiting on lab 

## 2019-12-23 LAB — RPR: RPR Ser Ql: NONREACTIVE

## 2019-12-23 NOTE — Pre-Procedure Instructions (Signed)
Dr Krista Blue aware of platelet count.  No further orders received.

## 2019-12-24 ENCOUNTER — Inpatient Hospital Stay (HOSPITAL_COMMUNITY)
Admission: RE | Admit: 2019-12-24 | Discharge: 2019-12-26 | DRG: 785 | Disposition: A | Discharge status: Home / Self Care | Payer: Medicaid Other | Attending: Obstetrics and Gynecology | Admitting: Obstetrics and Gynecology

## 2019-12-24 ENCOUNTER — Inpatient Hospital Stay (HOSPITAL_COMMUNITY): Payer: Medicaid Other | Admitting: Anesthesiology

## 2019-12-24 ENCOUNTER — Other Ambulatory Visit: Payer: Self-pay

## 2019-12-24 ENCOUNTER — Encounter (HOSPITAL_COMMUNITY)
Admission: RE | Disposition: A | Discharge status: Home / Self Care | Payer: Self-pay | Source: Home / Self Care | Attending: Obstetrics and Gynecology

## 2019-12-24 ENCOUNTER — Encounter (HOSPITAL_COMMUNITY): Payer: Self-pay | Admitting: Obstetrics and Gynecology

## 2019-12-24 DIAGNOSIS — N858 Other specified noninflammatory disorders of uterus: Secondary | ICD-10-CM

## 2019-12-24 DIAGNOSIS — O0990 Supervision of high risk pregnancy, unspecified, unspecified trimester: Secondary | ICD-10-CM

## 2019-12-24 DIAGNOSIS — Z3A39 39 weeks gestation of pregnancy: Secondary | ICD-10-CM

## 2019-12-24 DIAGNOSIS — O99824 Streptococcus B carrier state complicating childbirth: Secondary | ICD-10-CM | POA: Diagnosis present

## 2019-12-24 DIAGNOSIS — O34211 Maternal care for low transverse scar from previous cesarean delivery: Secondary | ICD-10-CM | POA: Diagnosis present

## 2019-12-24 DIAGNOSIS — Z3009 Encounter for other general counseling and advice on contraception: Secondary | ICD-10-CM | POA: Diagnosis present

## 2019-12-24 DIAGNOSIS — Z98891 History of uterine scar from previous surgery: Secondary | ICD-10-CM

## 2019-12-24 DIAGNOSIS — Z302 Encounter for sterilization: Secondary | ICD-10-CM

## 2019-12-24 DIAGNOSIS — Z8759 Personal history of other complications of pregnancy, childbirth and the puerperium: Secondary | ICD-10-CM

## 2019-12-24 DIAGNOSIS — R8271 Bacteriuria: Secondary | ICD-10-CM | POA: Diagnosis present

## 2019-12-24 SURGERY — Surgical Case
Anesthesia: Spinal | Wound class: Clean Contaminated

## 2019-12-24 MED ORDER — METOCLOPRAMIDE HCL 5 MG/ML IJ SOLN
INTRAMUSCULAR | Status: AC
  Filled 2019-12-24: qty 2

## 2019-12-24 MED ORDER — PHENYLEPHRINE HCL-NACL 20-0.9 MG/250ML-% IV SOLN
INTRAVENOUS | Status: AC
  Filled 2019-12-24: qty 250

## 2019-12-24 MED ORDER — NALBUPHINE HCL 10 MG/ML IJ SOLN: 5.0000 mg | Freq: Once | INTRAMUSCULAR | Status: DC | PRN

## 2019-12-24 MED ORDER — SIMETHICONE 80 MG PO CHEW: 80.0000 mg | CHEWABLE_TABLET | ORAL | Status: DC | PRN

## 2019-12-24 MED ORDER — KETOROLAC TROMETHAMINE 30 MG/ML IJ SOLN
INTRAMUSCULAR | Status: AC
  Filled 2019-12-24: qty 1

## 2019-12-24 MED ORDER — METOCLOPRAMIDE HCL 5 MG/ML IJ SOLN
INTRAMUSCULAR | Status: DC | PRN
  Administered 2019-12-24: 10 mg via INTRAVENOUS

## 2019-12-24 MED ORDER — LACTATED RINGERS IV SOLN: INTRAVENOUS | Status: DC

## 2019-12-24 MED ORDER — SIMETHICONE 80 MG PO CHEW
80.0000 mg | CHEWABLE_TABLET | Freq: Three times a day (TID) | ORAL | Status: DC
  Administered 2019-12-24 – 2019-12-26 (×5): 80 mg via ORAL
  Filled 2019-12-24 (×5): qty 1

## 2019-12-24 MED ORDER — OXYTOCIN-SODIUM CHLORIDE 30-0.9 UT/500ML-% IV SOLN
INTRAVENOUS | Status: AC
  Filled 2019-12-24: qty 500

## 2019-12-24 MED ORDER — DEXAMETHASONE SODIUM PHOSPHATE 4 MG/ML IJ SOLN
INTRAMUSCULAR | Status: DC | PRN
  Administered 2019-12-24: 8 mg via INTRAVENOUS

## 2019-12-24 MED ORDER — FENTANYL CITRATE (PF) 100 MCG/2ML IJ SOLN
INTRAMUSCULAR | Status: AC
  Filled 2019-12-24: qty 2

## 2019-12-24 MED ORDER — NALOXONE HCL 4 MG/10ML IJ SOLN
1.0000 ug/kg/h | INTRAVENOUS | Status: DC | PRN
  Filled 2019-12-24: qty 5

## 2019-12-24 MED ORDER — SODIUM CHLORIDE 0.9% FLUSH: 3.0000 mL | INTRAVENOUS | Status: DC | PRN

## 2019-12-24 MED ORDER — DIBUCAINE (PERIANAL) 1 % EX OINT: 1.0000 "application " | TOPICAL_OINTMENT | CUTANEOUS | Status: DC | PRN

## 2019-12-24 MED ORDER — SCOPOLAMINE 1 MG/3DAYS TD PT72
1.0000 | MEDICATED_PATCH | Freq: Once | TRANSDERMAL | Status: DC
  Administered 2019-12-24: 1.5 mg via TRANSDERMAL

## 2019-12-24 MED ORDER — BUPIVACAINE IN DEXTROSE 0.75-8.25 % IT SOLN
INTRATHECAL | Status: DC | PRN
  Administered 2019-12-24: 1.6 mL via INTRATHECAL

## 2019-12-24 MED ORDER — FENTANYL CITRATE (PF) 100 MCG/2ML IJ SOLN
INTRAMUSCULAR | Status: DC | PRN
  Administered 2019-12-24: 15 ug via INTRATHECAL

## 2019-12-24 MED ORDER — PHENYLEPHRINE 40 MCG/ML (10ML) SYRINGE FOR IV PUSH (FOR BLOOD PRESSURE SUPPORT)
PREFILLED_SYRINGE | INTRAVENOUS | Status: AC
  Filled 2019-12-24: qty 10

## 2019-12-24 MED ORDER — ONDANSETRON HCL 4 MG/2ML IJ SOLN: 4.0000 mg | Freq: Once | INTRAMUSCULAR | Status: DC | PRN

## 2019-12-24 MED ORDER — DIPHENHYDRAMINE HCL 50 MG/ML IJ SOLN: 12.5000 mg | INTRAMUSCULAR | Status: DC | PRN

## 2019-12-24 MED ORDER — OXYTOCIN-SODIUM CHLORIDE 30-0.9 UT/500ML-% IV SOLN: 2.5000 [IU]/h | INTRAVENOUS | Status: AC

## 2019-12-24 MED ORDER — SODIUM CHLORIDE 0.9 % IR SOLN
Status: DC | PRN
  Administered 2019-12-24: 1000 mL

## 2019-12-24 MED ORDER — LACTATED RINGERS IV SOLN: INTRAVENOUS | Status: DC | PRN

## 2019-12-24 MED ORDER — OXYCODONE HCL 5 MG PO TABS: 5.0000 mg | ORAL_TABLET | Freq: Once | ORAL | Status: DC | PRN

## 2019-12-24 MED ORDER — CEFAZOLIN SODIUM-DEXTROSE 2-4 GM/100ML-% IV SOLN: 2.0000 g | INTRAVENOUS | Status: DC

## 2019-12-24 MED ORDER — TETANUS-DIPHTH-ACELL PERTUSSIS 5-2.5-18.5 LF-MCG/0.5 IM SUSP: 0.5000 mL | Freq: Once | INTRAMUSCULAR | Status: DC

## 2019-12-24 MED ORDER — WITCH HAZEL-GLYCERIN EX PADS: 1.0000 "application " | MEDICATED_PAD | CUTANEOUS | Status: DC | PRN

## 2019-12-24 MED ORDER — DIPHENHYDRAMINE HCL 25 MG PO CAPS
25.0000 mg | ORAL_CAPSULE | ORAL | Status: DC | PRN
  Administered 2019-12-25 (×3): 25 mg via ORAL
  Filled 2019-12-24 (×3): qty 1

## 2019-12-24 MED ORDER — MORPHINE SULFATE (PF) 0.5 MG/ML IJ SOLN
INTRAMUSCULAR | Status: DC | PRN
  Administered 2019-12-24: 150 ug via INTRATHECAL

## 2019-12-24 MED ORDER — CEFAZOLIN SODIUM-DEXTROSE 2-4 GM/100ML-% IV SOLN
INTRAVENOUS | Status: AC
  Filled 2019-12-24: qty 100

## 2019-12-24 MED ORDER — MORPHINE SULFATE (PF) 0.5 MG/ML IJ SOLN
INTRAMUSCULAR | Status: AC
  Filled 2019-12-24: qty 10

## 2019-12-24 MED ORDER — OXYCODONE HCL 5 MG PO TABS
5.0000 mg | ORAL_TABLET | ORAL | Status: DC | PRN
  Administered 2019-12-25 (×2): 10 mg via ORAL
  Administered 2019-12-25 (×2): 5 mg via ORAL
  Administered 2019-12-25 – 2019-12-26 (×2): 10 mg via ORAL
  Filled 2019-12-24: qty 1
  Filled 2019-12-24 (×3): qty 2
  Filled 2019-12-24: qty 1
  Filled 2019-12-24: qty 2

## 2019-12-24 MED ORDER — COCONUT OIL OIL
1.0000 "application " | TOPICAL_OIL | Status: DC | PRN
  Administered 2019-12-25: 1 via TOPICAL

## 2019-12-24 MED ORDER — NALBUPHINE HCL 10 MG/ML IJ SOLN: 5.0000 mg | INTRAMUSCULAR | Status: DC | PRN

## 2019-12-24 MED ORDER — STERILE WATER FOR IRRIGATION IR SOLN
Status: DC | PRN
  Administered 2019-12-24: 1000 mL

## 2019-12-24 MED ORDER — MEPERIDINE HCL 25 MG/ML IJ SOLN
INTRAMUSCULAR | Status: DC | PRN
  Administered 2019-12-24 (×2): 6.25 mg via INTRAVENOUS

## 2019-12-24 MED ORDER — PRENATAL MULTIVITAMIN CH
1.0000 | ORAL_TABLET | Freq: Every day | ORAL | Status: DC
  Administered 2019-12-25 – 2019-12-26 (×2): 1 via ORAL
  Filled 2019-12-24 (×2): qty 1

## 2019-12-24 MED ORDER — SENNOSIDES-DOCUSATE SODIUM 8.6-50 MG PO TABS
2.0000 | ORAL_TABLET | ORAL | Status: DC
  Administered 2019-12-25 (×2): 2 via ORAL
  Filled 2019-12-24 (×2): qty 2

## 2019-12-24 MED ORDER — FENTANYL CITRATE (PF) 100 MCG/2ML IJ SOLN
25.0000 ug | INTRAMUSCULAR | Status: DC | PRN
  Administered 2019-12-24 (×2): 25 ug via INTRAVENOUS

## 2019-12-24 MED ORDER — ONDANSETRON HCL 4 MG/2ML IJ SOLN
INTRAMUSCULAR | Status: AC
  Filled 2019-12-24: qty 2

## 2019-12-24 MED ORDER — MEPERIDINE HCL 25 MG/ML IJ SOLN
INTRAMUSCULAR | Status: AC
  Filled 2019-12-24: qty 1

## 2019-12-24 MED ORDER — OXYTOCIN 10 UNIT/ML IJ SOLN
INTRAMUSCULAR | Status: DC | PRN
  Administered 2019-12-24: 40 [IU]

## 2019-12-24 MED ORDER — OXYCODONE HCL 5 MG/5ML PO SOLN: 5.0000 mg | Freq: Once | ORAL | Status: DC | PRN

## 2019-12-24 MED ORDER — DEXAMETHASONE SODIUM PHOSPHATE 4 MG/ML IJ SOLN
INTRAMUSCULAR | Status: AC
  Filled 2019-12-24: qty 2

## 2019-12-24 MED ORDER — SODIUM CHLORIDE 0.9 % IV SOLN: INTRAVENOUS | Status: DC | PRN

## 2019-12-24 MED ORDER — CEFAZOLIN SODIUM-DEXTROSE 2-3 GM-%(50ML) IV SOLR
INTRAVENOUS | Status: DC | PRN
  Administered 2019-12-24: 2 g via INTRAVENOUS

## 2019-12-24 MED ORDER — NALOXONE HCL 0.4 MG/ML IJ SOLN: 0.4000 mg | INTRAMUSCULAR | Status: DC | PRN

## 2019-12-24 MED ORDER — SCOPOLAMINE 1 MG/3DAYS TD PT72
MEDICATED_PATCH | TRANSDERMAL | Status: AC
  Filled 2019-12-24: qty 1

## 2019-12-24 MED ORDER — PHENYLEPHRINE HCL-NACL 20-0.9 MG/250ML-% IV SOLN
INTRAVENOUS | Status: DC | PRN
  Administered 2019-12-24: 60 ug/min via INTRAVENOUS

## 2019-12-24 MED ORDER — MEPERIDINE HCL 25 MG/ML IJ SOLN: 6.2500 mg | INTRAMUSCULAR | Status: DC | PRN

## 2019-12-24 MED ORDER — MENTHOL 3 MG MT LOZG: 1.0000 | LOZENGE | OROMUCOSAL | Status: DC | PRN

## 2019-12-24 MED ORDER — SIMETHICONE 80 MG PO CHEW
80.0000 mg | CHEWABLE_TABLET | ORAL | Status: DC
  Administered 2019-12-25 (×2): 80 mg via ORAL
  Filled 2019-12-24 (×2): qty 1

## 2019-12-24 MED ORDER — ZOLPIDEM TARTRATE 5 MG PO TABS: 5.0000 mg | ORAL_TABLET | Freq: Every evening | ORAL | Status: DC | PRN

## 2019-12-24 MED ORDER — ONDANSETRON HCL 4 MG/2ML IJ SOLN: 4.0000 mg | Freq: Three times a day (TID) | INTRAMUSCULAR | Status: DC | PRN

## 2019-12-24 MED ORDER — DIPHENHYDRAMINE HCL 25 MG PO CAPS: 25.0000 mg | ORAL_CAPSULE | Freq: Four times a day (QID) | ORAL | Status: DC | PRN

## 2019-12-24 MED ORDER — KETOROLAC TROMETHAMINE 30 MG/ML IJ SOLN: 30.0000 mg | Freq: Once | INTRAMUSCULAR | Status: DC | PRN

## 2019-12-24 MED ORDER — ONDANSETRON HCL 4 MG/2ML IJ SOLN
INTRAMUSCULAR | Status: DC | PRN
  Administered 2019-12-24: 4 mg via INTRAVENOUS

## 2019-12-24 SURGICAL SUPPLY — 40 items
BENZOIN TINCTURE PRP APPL 2/3 (GAUZE/BANDAGES/DRESSINGS) ×3 IMPLANT
CHLORAPREP W/TINT 26ML (MISCELLANEOUS) ×3 IMPLANT
CLAMP CORD UMBIL (MISCELLANEOUS) IMPLANT
CLOSURE WOUND 1/2 X4 (GAUZE/BANDAGES/DRESSINGS) ×1
CLOTH BEACON ORANGE TIMEOUT ST (SAFETY) ×3 IMPLANT
DERMABOND ADVANCED (GAUZE/BANDAGES/DRESSINGS) ×2
DERMABOND ADVANCED .7 DNX12 (GAUZE/BANDAGES/DRESSINGS) IMPLANT
DRSG OPSITE POSTOP 4X10 (GAUZE/BANDAGES/DRESSINGS) ×3 IMPLANT
ELECT REM PT RETURN 9FT ADLT (ELECTROSURGICAL) ×3
ELECTRODE REM PT RTRN 9FT ADLT (ELECTROSURGICAL) ×1 IMPLANT
EXTRACTOR VACUUM M CUP 4 TUBE (SUCTIONS) IMPLANT
EXTRACTOR VACUUM M CUP 4' TUBE (SUCTIONS)
GAUZE SPONGE 4X4 12PLY STRL LF (GAUZE/BANDAGES/DRESSINGS) ×4 IMPLANT
GLOVE BIOGEL PI IND STRL 7.0 (GLOVE) ×2 IMPLANT
GLOVE BIOGEL PI IND STRL 7.5 (GLOVE) ×2 IMPLANT
GLOVE BIOGEL PI INDICATOR 7.0 (GLOVE) ×4
GLOVE BIOGEL PI INDICATOR 7.5 (GLOVE) ×4
GLOVE ECLIPSE 7.5 STRL STRAW (GLOVE) ×3 IMPLANT
GOWN STRL REUS W/TWL LRG LVL3 (GOWN DISPOSABLE) ×9 IMPLANT
HEMOSTAT ARISTA ABSORB 3G PWDR (HEMOSTASIS) ×2 IMPLANT
KIT ABG SYR 3ML LUER SLIP (SYRINGE) IMPLANT
NDL HYPO 25X5/8 SAFETYGLIDE (NEEDLE) IMPLANT
NEEDLE HYPO 25X5/8 SAFETYGLIDE (NEEDLE) IMPLANT
NS IRRIG 1000ML POUR BTL (IV SOLUTION) ×3 IMPLANT
PACK C SECTION WH (CUSTOM PROCEDURE TRAY) ×3 IMPLANT
PAD ABD 7.5X8 STRL (GAUZE/BANDAGES/DRESSINGS) ×2 IMPLANT
PAD OB MATERNITY 4.3X12.25 (PERSONAL CARE ITEMS) ×3 IMPLANT
PENCIL SMOKE EVAC W/HOLSTER (ELECTROSURGICAL) ×3 IMPLANT
RTRCTR C-SECT PINK 25CM LRG (MISCELLANEOUS) ×3 IMPLANT
STRIP CLOSURE SKIN 1/2X4 (GAUZE/BANDAGES/DRESSINGS) ×2 IMPLANT
SUT PLAIN 0 NONE (SUTURE) ×3 IMPLANT
SUT PLAIN 2 0 XLH (SUTURE) ×2 IMPLANT
SUT VIC AB 0 CT1 36 (SUTURE) ×3 IMPLANT
SUT VIC AB 2-0 CT1 (SUTURE) ×6 IMPLANT
SUT VIC AB 2-0 CT1 27 (SUTURE) ×6
SUT VIC AB 2-0 CT1 TAPERPNT 27 (SUTURE) ×1 IMPLANT
SUT VIC AB 4-0 KS 27 (SUTURE) ×3 IMPLANT
TOWEL OR 17X24 6PK STRL BLUE (TOWEL DISPOSABLE) ×3 IMPLANT
TRAY FOLEY W/BAG SLVR 14FR LF (SET/KITS/TRAYS/PACK) ×3 IMPLANT
WATER STERILE IRR 1000ML POUR (IV SOLUTION) ×3 IMPLANT

## 2019-12-24 NOTE — Discharge Summary (Signed)
Postpartum Discharge Summary      Patient Name: Tara Romero DOB: 10/03/1990 MRN: [id number]  Date of admission: 12/24/2019 Delivery date:12/24/2019  Delivering provider: Laurey Arrow [facility]  Date of discharge: 12/26/2019  Admitting diagnosis: History of cesarean delivery [Z98.891] Status post repeat low transverse cesarean section [Z98.891] Intrauterine pregnancy: [redacted]w[redacted]d    Secondary diagnosis:  Active Problems:   History of gestational hypertension   Supervision of high risk pregnancy, antepartum   GBS bacteriuria   History of cesarean delivery   Unwanted fertility   Cesarean delivery delivered   Status post repeat low transverse cesarean section  Additional problems: none    Discharge diagnosis: Term Pregnancy Delivered                                              Post partum procedures: bilateral selpingectomy, done at time of Cesarean Augmentation: N/A Complications: None  Hospital course: Sceduled C/S   29y.o. yo [id number] admitted to the hospital 12/24/2019 for scheduled cesarean section with the following indication:Elective Repeat.Delivery details are as follows:  Membrane Rupture Time/Date: 1:09 PM ,12/24/2019   Delivery Method:C-Section, Low Transverse  Details of operation can be found in separate operative note.  Patient had an uncomplicated postpartum course.  She is ambulating, tolerating a regular diet, passing flatus, and urinating well. Patient is discharged home in stable condition on  12/26/19        Newborn Data: Birth date:12/24/2019  Birth time:1:10 PM  Gender:Female  Living status:Living  Apgars:8 ,9  Weight:3145 g     Magnesium Sulfate received: No BMZ received: No Rhophylac:N/A MMR:N/A T-DaP:Given prenatally Flu: N/A Transfusion:No  Physical exam  Vitals:   12/25/19 0752 12/25/19 1529 12/25/19 2100 12/26/19 0531  BP: 117/79 120/60 120/81 134/89  Pulse: 72 85 73 77  Resp: _0 Temp: 98.7 F (37.1 C) 98.5 F (36.9 C) 98.2  F (36.8 C)   TempSrc: Oral Oral Oral   SpO2: 99% 99%    Weight:      Height:       General: alert, cooperative and no distress Lochia: appropriate Uterine Fundus: firm Incision: Healing well with no significant drainage, No significant erythema, Dressing is clean, dry, and intact DVT Evaluation: No evidence of DVT seen on physical exam. Negative Homan's sign. No cords or calf tenderness. Labs: Lab Results  Component Value Date   WBC 15.6 (H) 12/25/2019   HGB 11.4 (L) 12/25/2019   HCT 33.3 (L) 12/25/2019   MCV 88.8 12/25/2019   PLT 137 (L) 12/25/2019   CMP Latest Ref Rng & Units 12/18/2019  Glucose 70 - 99 mg/dL 79  BUN 6 - 20 mg/dL 6  Creatinine 0.44 - 1.00 mg/dL 0.57  Sodium 135 - 145 mmol/L 137  Potassium 3.5 - 5.1 mmol/L 3.9  Chloride 98 - 111 mmol/L 104  CO2 22 - 32 mmol/L 21(L)  Calcium 8.9 - 10.3 mg/dL 8.8(L)  Total Protein 6.5 - 8.1 g/dL 6.4(L)  Total Bilirubin 0.3 - 1.2 mg/dL 0.4  Alkaline Phos 38 - 126 U/L 84  AST 15 - 41 U/L 24  ALT 0 - 44 U/L 26   Edinburgh Score: Edinburgh Postnatal Depression Scale Screening Tool 12/25/2019  I have been able to laugh and see the funny side of things. 0  I have looked forward with  enjoyment to things. 0  I have blamed myself unnecessarily when things went wrong. 1  I have been anxious or worried for no good reason. 0  I have felt scared or panicky for no good reason. 1  Things have been getting on top of me. 0  I have been so unhappy that I have had difficulty sleeping. 0  I have felt sad or miserable. 0  I have been so unhappy that I have been crying. 0  The thought of harming myself has occurred to me. 0  Edinburgh Postnatal Depression Scale Total 2     After visit meds:  Allergies as of 12/26/2019   No Known Allergies     Medication List    STOP taking these medications   Blood Pressure Kit Devi   butalbital-acetaminophen-caffeine 50-325-40 MG tablet Commonly known as: FIORICET   famotidine 20 MG  tablet Commonly known as: Pepcid     TAKE these medications   ibuprofen 600 MG tablet Commonly known as: ADVIL Take 1 tablet (600 mg total) by mouth every 6 (six) hours.   oxyCODONE 5 MG immediate release tablet Commonly known as: Oxy IR/ROXICODONE Take 1 tablet (5 mg total) by mouth every 6 (six) hours as needed for moderate pain or severe pain.   prenatal vitamin w/FE, FA 27-1 MG Tabs tablet Take 1 tablet by mouth daily at 12 noon.        Discharge home in stable condition Infant Feeding: Breast Infant Disposition:home with mother Discharge instruction: per After Visit Summary and Postpartum booklet. Activity: Advance as tolerated. Pelvic rest for 6 weeks.  Diet: routine diet Future Appointments: Future Appointments  Date Time Provider [place]  01/01/2020 10:00 AM [facility] NURSE [facility]  01/27/2020  9:35 AM Tresea Mall, CNM [facility]   Follow up Visit: [place] [facility] [place] [facility] Follow up on 01/01/2020.   Specialty: Obstetrics and Gynecology Why: for incision check Contact information: [place] [id number] [phone]           Please schedule this patient for an In person postpartum visit in 4 weeks with the following provider: Any provider. Additional Postpartum F/U:Incision check 1 week  High risk pregnancy complicated by: h/o GHTN, H/o CS Delivery mode:  C-Section, Low Transverse  Anticipated Birth Control:  salpingectomy at time of CS   12/26/2019 Christin Fudge, CNM

## 2019-12-24 NOTE — Op Note (Signed)
Operative Note   SURGERY DATE: 12/24/2019  PRE-OP DIAGNOSIS:  *Pregnancy at 39 weeks *H/o Cesarean delivery *Unwanted Fertility  POST-OP DIAGNOSIS:  *Cesarean delivery, delivered  *Unwanted fertility  PROCEDURE: repeat low transverse cesarean section via pfannenstiel skin incision with double layer uterine closure, bilateral tubal sterilization via salpingectomy  SURGEON: Surgeon(s) and Role:    * Wouk, Wilfred Curtis, MD - Primary    * Bonham Zingale, Bogard, DO- Assisting  ASSISTANT:     Scheryl Darter, MD  ANESTHESIA: spinal  ESTIMATED BLOOD LOSS: 436 mL  DRAINS: 650 mL UOP via indwelling foley  TOTAL IV FLUIDS: 1200 mL crystalloid  VTE PROPHYLAXIS: SCDs to bilateral lower extremities  ANTIBIOTICS: Two grams of Cefazolin were given, within 1 hour of skin incision  SPECIMENS: Left and right fallopian tubes  COMPLICATIONS: None  INDICATIONS: Repeat elective Cesarean delivery, unwanted fertility desiring permanent sterilization with bilateral salpingectomy  FINDINGS: No intra-abdominal adhesions were noted. Grossly normal uterus, tubes and ovaries. Clear amniotic fluid, cephalic female infant, weight 3145 gm, APGARs 8/9, intact placenta.  PROCEDURE IN DETAIL: The patient was taken to the operating room where anesthesia was administered and normal fetal heart tones were confirmed. She was then prepped and draped in the normal fashion in the dorsal supine position with a leftward tilt.  After a time out was performed, a pfannensteil skin incision was made with the scalpel and carried through to the underlying layer of fascia. The fascia was then incised at the midline and this incision was extended laterally with the mayo scissors. Attention was turned to the superior aspect of the fascial incision which was grasped with the kocher clamps x 2, tented up and the rectus muscles were dissected off bluntly and sharply. In a similar fashion the inferior aspect of the fascial incision was  grasped with the kocher clamps, tented up and the rectus muscles dissected off with the mayo scissors. The rectus muscles were then separated in the midline and the peritoneum was entered bluntly. The Alexis retractor was inserted and the vesicouterine peritoneum was identified.  A low transverse hysterotomy was made with the scalpel until the endometrial cavity was breached and the amniotic sac ruptured, yielding clear amniotic fluid. This incision was extended bluntly and the infants head, shoulders and body were delivered atraumatically.The cord was clamped x 2 and cut, and the infant was handed to the awaiting pediatricians, after delayed cord clamping was done.  The placenta was then gradually expressed from the uterus and then the uterus was cleared of all clots and debris. The hysterotomy was repaired with a running suture of 0 Vicryl. A second imbricating layer of 0 Vicryl suture was then placed and electrocautery was used achieve excellent hemostasis.   At this point, attention was turned to the adnexa and bilateral salpingectomy was carried out by Dr. Ashok Pall and Dr. Debroah Loop. The right Fallopian tube was identified and salpingectomy was performed with Kelly clamps and Metzenbaum scissors using the clamp, cut, ligate method. Attention was then turned to the left Fallopian tube and a similar procedure was carried out. Both sites were re-inspected, and electrocautery was used to achieve excellent hemostasis.  The hysterotomy and all operative sites were reinspected and excellent hemostasis was noted after irrigation and suction of the abdomen with warm saline.  The peritoneum was closed with a running stitch of 3-0 Vicryl. The rectus muscles were inspected, and Arista was used for added hemostasis after the use of electrocautery for a few perforating vessels. The fascia was reapproximated  with 0 Vicryl in a simple running fashion bilaterally. The subcutaneous layer was then reapproximated with a running  suture of 2-0 plain gut, and the skin was then closed with 4-0 Vicryl, in a subcuticular fashion.  The patient  tolerated the procedure well. Sponge, lap, needle, and instrument counts were correct x 2. The patient was transferred to the recovery room awake, alert and breathing independently in stable condition.  An experienced assistant was required given the standard of surgical care given the complexity of the case.  This assistant was needed for exposure, dissection, suctioning, retraction, instrument exchange, and for overall help during the procedure.  Merilyn Baba, DO [facility] [employer])

## 2019-12-24 NOTE — H&P (Signed)
OBSTETRIC ADMISSION HISTORY AND PHYSICAL  Tara Romero is a 29 y.o. female [phone] with IUP at 19w1dpresenting for repeat elective Cesarean delivery. She reports +FMs. No LOF, VB, blurry vision, headaches, peripheral edema, or RUQ pain. She plans on breastfeeding. She requests bilateral salpingectomy for birth control.  Dating: By LMP c/w anatomy scan --->  Estimated Date of Delivery: 12/30/19  Sono:   @[redacted]w[redacted]d , normal anatomy, variable presentation, 284g, 45%ile, EFW 0#10 -incomplete D. ARCH and PROFILE  Prenatal History/Complications: H/o Cesarean delivery H/o gestational hypertension with last pregnancy- was on ASA 81 mg GBS bacteriuria  Past Medical History: Past Medical History:  Diagnosis Date   Gestational diabetes    Missed ab 05/29/2018    Past Surgical History: Past Surgical History:  Procedure Laterality Date   CESAREAN SECTION N/A 04/08/2015   Procedure: CESAREAN SECTION;  Surgeon: AMalachy Mood MD;  Location: [facility] ORS;  Service: Obstetrics;  Laterality: N/A;   DILATION AND EVACUATION N/A 06/25/2018   Procedure: DILATATION AND EVACUATION;  Surgeon: HGae Dry MD;  Location: [facility] ORS;  Service: Gynecology;  Laterality: N/A;    Obstetrical History: OB History    Gravida  4   Para  1   Term  1   Preterm      AB  2   Living  1     SAB  2   TAB      Ectopic      Multiple  0   Live Births  1           Social History: Social History   Socioeconomic History   Marital status: Single    Spouse name: Not on file   Number of children: Not on file   Years of education: Not on file   Highest education level: Not on file  Occupational History   Not on file  Tobacco Use   Smoking status: Never Smoker   Smokeless tobacco: Never Used  Substance and Sexual Activity   Alcohol use: No   Drug use: No   Sexual activity: Yes    Partners: Male    Birth control/protection: None  Other Topics Concern   Not on file  Social  History Narrative   Not on file   Social Determinants of Health   Financial Resource Strain:    Difficulty of Paying Living Expenses:   Food Insecurity: No Food Insecurity   Worried About Running Out of Food in the Last Year: Never true   Ran Out of Food in the Last Year: Never true  Transportation Needs: No Transportation Needs   Lack of Transportation (Medical): No   Lack of Transportation (Non-Medical): No  Physical Activity:    Days of Exercise per Week:    Minutes of Exercise per Session:   Stress:    Feeling of Stress :   Social Connections:    Frequency of Communication with Friends and Family:    Frequency of Social Gatherings with Friends and Family:    Attends Religious Services:    Active Member of Clubs or Organizations:    Attends CArchivistMeetings:    Marital Status:     Family History: History reviewed. No pertinent family history.  Allergies: No Known Allergies  Medications Prior to Admission  Medication Sig Dispense Refill Last Dose   famotidine (PEPCID) 20 MG tablet Take 1 tablet (20 mg total) by mouth 2 (two) times daily. (Patient taking differently: Take 20 mg by mouth daily at 12 noon. )  60 tablet 0    prenatal vitamin w/FE, FA (PRENATAL 1 + 1) 27-1 MG TABS tablet Take 1 tablet by mouth daily at 12 noon. 30 tablet 4 12/23/2019 at Unknown time   Blood Pressure Monitoring (BLOOD PRESSURE KIT) DEVI 1 Device by Does not apply route as needed. 1 Device 0    butalbital-acetaminophen-caffeine (FIORICET) 50-325-40 MG tablet Take 1-2 tablets by mouth every 6 (six) hours as needed for headache. 20 tablet 0      Review of Systems:  All systems reviewed and negative except as stated in HPI  PE: Blood pressure 124/90, pulse (!) 104, resp. rate 18, height 5' 6"  (1.676 m), weight 83.9 kg, last menstrual period 03/25/2019, SpO2 100 %, unknown if currently breastfeeding. General appearance: alert, cooperative and appears stated  age Lungs: regular rate and effort Heart: regular rate  Abdomen: soft, non-tender Extremities: Homans sign is negative, no sign of DVT Presentation: cephalic  Fetal heart tones confirmed by Doppler    Prenatal labs: ABO, Rh: --/--/AB POS, AB POS Performed at [facility], [address] [place], [place] [address]  [phone]) Antibody: NEG (05/31 9499) Rubella: 6.87 (12/02 1000) RPR: NON REACTIVE (05/31 0920)  HBsAg: Negative (12/02 1000)  HIV: Non Reactive (03/29 0933)  GBS:    2 hr GTT normal  Prenatal Transfer Tool  Maternal Diabetes: No Genetic Screening: Normal Maternal Ultrasounds/Referrals: Normal Fetal Ultrasounds or other Referrals:  None Maternal Substance Abuse:  No Significant Maternal Medications:  None Significant Maternal Lab Results: Group B Strep positive  No results found for this or any previous visit (from the past 24 hour(s)).  Patient Active Problem List   Diagnosis Date Noted   History of cesarean delivery 09/24/2019   GBS bacteriuria 07/23/2019   Supervision of high risk pregnancy, antepartum 06/02/2019   History of gestational hypertension 05/15/2018    Assessment: Tara Romero is a 29 y.o. [id number] at 22w1dhere for repeat Cesarean section and bilateral salpingectomy.   Plan: The risks of cesarean section were discussed with the patient including but were not limited to: bleeding which may require transfusion or reoperation; infection which may require antibiotics; injury to bowel, bladder, ureters or other surrounding organs; injury to the fetus; need for additional procedures including hysterectomy in the event of a life-threatening hemorrhage; placental abnormalities wth subsequent pregnancies, incisional problems, thromboembolic phenomenon and other postoperative/anesthesia complications.    Patient also desires permanent sterilization.  Other reversible forms of contraception were discussed with patient; she declines all other  modalities. Risks of procedure discussed with patient including but not limited to: risk of regret, permanence of method, bleeding, infection, injury to surrounding organs and need for additional procedures.  Failure risk of about 1% with increased risk of ectopic gestation if pregnancy occurs was also discussed with patient.  Also discussed possibility of post-tubal pain syndrome. The patient concurred with the proposed plan, giving informed written consent for the procedures.  Patient has been NPO since last night she will remain NPO for procedure. Anesthesia and OR aware.  Preoperative prophylactic antibiotics and SCDs ordered on call to the OR.  To OR when ready.   [place] DO  12/24/2019, 11:05 AM

## 2019-12-24 NOTE — Discharge Instructions (Signed)

## 2019-12-24 NOTE — Anesthesia Procedure Notes (Signed)
Spinal  Patient location during procedure: OR Staffing Performed: anesthesiologist  Anesthesiologist: Bayard More E, MD Preanesthetic Checklist Completed: patient identified, IV checked, risks and benefits discussed, surgical consent, monitors and equipment checked, pre-op evaluation and timeout performed Spinal Block Patient position: sitting Prep: DuraPrep and site prepped and draped Patient monitoring: continuous pulse ox, blood pressure and heart rate Approach: midline Location: L3-4 Injection technique: single-shot Needle Needle type: Pencan  Needle gauge: 24 G Needle length: 9 cm Additional Notes Functioning IV was confirmed and monitors were applied. Sterile prep and drape, including hand hygiene and sterile gloves were used. The patient was positioned and the spine was prepped. The skin was anesthetized with lidocaine.  Free flow of clear CSF was obtained prior to injecting local anesthetic into the CSF. The needle was carefully withdrawn. The patient tolerated the procedure well.      

## 2019-12-24 NOTE — Transfer of Care (Signed)
Immediate Anesthesia Transfer of Care Note  Patient: Tara Romero  Procedure(s) Performed: CESAREAN SECTION WITH BILATERAL SALPINGECTOMY (N/A )  Patient Location: PACU  Anesthesia Type:Spinal  Level of Consciousness: awake, alert  and oriented  Airway & Oxygen Therapy: Patient Spontanous Breathing  Post-op Assessment: Report given to RN and Post -op Vital signs reviewed and stable  Post vital signs: Reviewed and stable  Last Vitals:  Vitals Value Taken Time  BP 111/53 12/24/19 1428  Temp    Pulse 91 12/24/19 1429  Resp 20 12/24/19 1429  SpO2 100 % 12/24/19 1429  Vitals shown include unvalidated device data.  Last Pain:  Vitals:   12/24/19 1039  TempSrc: Oral         Complications: No apparent anesthesia complications

## 2019-12-24 NOTE — Anesthesia Preprocedure Evaluation (Signed)
Anesthesia Evaluation  Patient identified by MRN, date of birth, ID band Patient awake    Reviewed: Allergy & Precautions, H&P , NPO status , Patient's Chart, lab work & pertinent test results  History of Anesthesia Complications Negative for: history of anesthetic complications  Airway Mallampati: II  TM Distance: >3 FB Neck ROM: full    Dental no notable dental hx.    Pulmonary neg pulmonary ROS,    Pulmonary exam normal        Cardiovascular negative cardio ROS Normal cardiovascular exam     Neuro/Psych negative neurological ROS  negative psych ROS   GI/Hepatic negative GI ROS, Neg liver ROS,   Endo/Other  diabetes, Gestational  Renal/GU negative Renal ROS  negative genitourinary   Musculoskeletal   Abdominal   Peds  Hematology negative hematology ROS (+)   Anesthesia Other Findings   Reproductive/Obstetrics (+) Pregnancy                             Anesthesia Physical Anesthesia Plan  ASA: II  Anesthesia Plan: Spinal   Post-op Pain Management:    Induction:   PONV Risk Score and Plan: Ondansetron and Treatment may vary due to age or medical condition  Airway Management Planned:   Additional Equipment:   Intra-op Plan:   Post-operative Plan:   Informed Consent: I have reviewed the patients History and Physical, chart, labs and discussed the procedure including the risks, benefits and alternatives for the proposed anesthesia with the patient or authorized representative who has indicated his/her understanding and acceptance.       Plan Discussed with:   Anesthesia Plan Comments:         Anesthesia Quick Evaluation

## 2019-12-25 ENCOUNTER — Encounter: Payer: Self-pay | Admitting: *Deleted

## 2019-12-25 LAB — CBC
HCT: 33.3 % — ABNORMAL LOW (ref 36.0–46.0)
Hemoglobin: 11.4 g/dL — ABNORMAL LOW (ref 12.0–15.0)
MCH: 30.4 pg (ref 26.0–34.0)
MCHC: 34.2 g/dL (ref 30.0–36.0)
MCV: 88.8 fL (ref 80.0–100.0)
Platelets: 137 10*3/uL — ABNORMAL LOW (ref 150–400)
RBC: 3.75 MIL/uL — ABNORMAL LOW (ref 3.87–5.11)
RDW: 12.4 % (ref 11.5–15.5)
WBC: 15.6 10*3/uL — ABNORMAL HIGH (ref 4.0–10.5)
nRBC: 0 % (ref 0.0–0.2)

## 2019-12-25 MED ORDER — IBUPROFEN 600 MG PO TABS
600.0000 mg | ORAL_TABLET | Freq: Four times a day (QID) | ORAL | Status: DC
  Administered 2019-12-25 – 2019-12-26 (×3): 600 mg via ORAL
  Filled 2019-12-25 (×3): qty 1

## 2019-12-25 MED ORDER — ACETAMINOPHEN 325 MG PO TABS
650.0000 mg | ORAL_TABLET | Freq: Four times a day (QID) | ORAL | Status: DC | PRN
  Administered 2019-12-25 – 2019-12-26 (×2): 650 mg via ORAL
  Filled 2019-12-25 (×2): qty 2

## 2019-12-25 MED ORDER — IBUPROFEN 600 MG PO TABS
600.0000 mg | ORAL_TABLET | Freq: Four times a day (QID) | ORAL | Status: DC
  Administered 2019-12-25 (×2): 600 mg via ORAL
  Filled 2019-12-25 (×2): qty 1

## 2019-12-25 NOTE — Lactation Note (Signed)
This note was copied from a baby's chart. Lactation Consultation Note  Patient Name: Tara Romero [id number] Date: 12/25/2019 Reason for consult: Initial assessment   Mother is a P2, infant is 18 hours old . Staff nurse reports that infant has not had good feedings today. Mother has been using a NS with infant sliding on and off the shield with little to no milk transfer in the shield.  Mother was given [facility] brochure and basic teaching done.    Reviewed hand expression with mother. Mother expresses tiny drops of colostrum from her nipples. Mothers nipples are pink from attempting to use NS and attempting to breastfeed. Mothers breast tissue is firm. She denies having had breast implants.   Mother has a DEBP sat up at the bedside. Mother reports that she used at 2 am and had drops on the flange.   Mother signed consent for infant to receive donor milk.  Infant was given 12-13 ml of donor milk with curved tip syringe.   Breastfeed infant with feeding cues Supplement infant with ebm/donor milk  according to supplemental guidelines. Pump using a DEBP after each feeding for 15-20 mins.   Mother to continue to cue base feed infant and feed at least 8-12 times or more in 24 hours and advised to allow for cluster feeding infant as needed.   Mother to continue to due STS. Mother is aware of available LC services at [facility], BFSG'S, OP Dept, and phone # for questions or concerns about breastfeeding.  Mother receptive to all teaching and plan of care.     Maternal Data Has patient been taught Hand Expression?: Yes Does the patient have breastfeeding experience prior to this delivery?: Yes  Feeding Feeding Type: Breast Fed  LATCH Score Latch: Too sleepy or reluctant, no latch achieved, no sucking elicited.  Audible Swallowing: None  Type of Nipple: Flat  Comfort (Breast/Nipple): Soft / non-tender  Hold (Positioning): Assistance needed to correctly position infant at breast and maintain  latch.  LATCH Score: 4  Interventions Interventions: Assisted with latch;Skin to skin;Support pillows  Lactation Tools Discussed/Used Tools: Nipple Shields Nipple shield size: 20   Consult Status      Tara Romero 12/25/2019, 2:54 PM

## 2019-12-25 NOTE — Anesthesia Postprocedure Evaluation (Signed)
Anesthesia Post Note  Patient: Tara Romero  Procedure(s) Performed: CESAREAN SECTION WITH BILATERAL SALPINGECTOMY (N/A )     Patient location during evaluation: PACU Anesthesia Type: Spinal Level of consciousness: oriented and awake and alert Pain management: pain level controlled Vital Signs Assessment: post-procedure vital signs reviewed and stable Respiratory status: spontaneous breathing, respiratory function stable and nonlabored ventilation Cardiovascular status: blood pressure returned to baseline and stable Postop Assessment: no headache, no backache, no apparent nausea or vomiting and spinal receding Anesthetic complications: no    Last Vitals:  Vitals:   12/25/19 0500 12/25/19 0752  BP: 116/74 117/79  Pulse: 69 72  Resp: 16 18  Temp: 36.8 C 37.1 C  SpO2: 95% 99%    Last Pain:  Vitals:   12/25/19 1044  TempSrc:   PainSc: 6                  Lucretia Kern

## 2019-12-25 NOTE — Plan of Care (Signed)
Patient's greatest challenge today has been incisional pain management. It is becoming more controlled over the course of the day with Oxy and Ibuprofen. Patient taken a shower and ambulated in the room and hall.

## 2019-12-25 NOTE — Progress Notes (Signed)
Post Operative Day 1 s/p rLTCS with BTS (salpingectomy) Subjective: up ad lib, voiding, tolerating PO and pain currently 7-8/10. Lochia minimal.  Objective: Blood pressure 117/79, pulse 72, temperature 98.7 F (37.1 C), temperature source Oral, resp. rate 18, height 5\' 6"  (1.676 m), weight 83.9 kg, last menstrual period 03/25/2019, SpO2 99 %, unknown if currently breastfeeding.  Physical Exam:  General: alert and cooperative Lochia: appropriate Uterine Fundus: firm Incision: Dressing c/d/i DVT Evaluation: No evidence of DVT seen on physical exam.  Recent Labs    12/22/19 0920 12/25/19 0429  HGB 13.8 11.4*  HCT 39.8 33.3*    Assessment/Plan: Plan for discharge tomorrow, Breastfeeding and Contraception no s/p BTS (salpingectomy)   LOS: 1 day   Tara Romero 02/24/20, MD PGY-2 Resident Family Medicine 12/25/2019, 9:19 AM

## 2019-12-26 MED ORDER — OXYCODONE HCL 5 MG PO TABS: 5.0000 mg | ORAL_TABLET | Freq: Four times a day (QID) | ORAL | 0 refills | Status: DC | PRN

## 2019-12-26 MED ORDER — ONDANSETRON 4 MG PO TBDP
4.0000 mg | ORAL_TABLET | Freq: Four times a day (QID) | ORAL | Status: DC | PRN
  Administered 2019-12-26: 4 mg via ORAL
  Filled 2019-12-26: qty 1

## 2019-12-26 MED ORDER — IBUPROFEN 600 MG PO TABS: 600.0000 mg | ORAL_TABLET | Freq: Four times a day (QID) | ORAL | 0 refills | Status: AC

## 2019-12-26 NOTE — Lactation Note (Signed)
This note was copied from a baby's chart. Lactation Consultation Note  Patient Name: Tara Romero CHYIF'O Date: 12/26/2019 Reason for consult: Follow-up assessment   Baby 19 hours old and mother states baby cluster fed last night. Mother is supplementing with donor milk and pumping w/ DEBP. Assisted w/ latching with #20NS.  Reviewed how to apply NS and provided extra NS. Attempted latching with NS and 5 french feeding tube but baby fell back asleep. Mother has DEBP at home.  Discussed continued to supplement after until mother's milk transitions. Feed on demand with cues.  Goal 8-12+ times per day after first 24 hrs.  Place baby STS if not cueing.  Reviewed engorgement care and monitoring voids/stools. Mother did not have set up for cleaning.  Assisted w/ cleaning pump part for next pumping session. Due to sleepiness at breast, baby is being supplemented with slow flow nipple.     Maternal Data Has patient been taught Hand Expression?: Yes  Feeding Feeding Type: Donor Breast Milk  LATCH Score                   Interventions Interventions: Breast feeding basics reviewed;Assisted with latch;Hand express;Adjust position;Support pillows;Expressed milk;DEBP  Lactation Tools Discussed/Used Tools: Pump;Nipple Shields;78F feeding tube / Syringe Nipple shield size: 20 Breast pump type: Double-Electric Breast Pump Pump Review: Setup, frequency, and cleaning Initiated by:: RN   Consult Status Consult Status: Follow-up Date: 12/27/19 Follow-up type: In-patient    [name] 12/26/2019, 10:39 AM

## 2020-01-01 ENCOUNTER — Telehealth: Payer: Self-pay | Admitting: *Deleted

## 2020-01-01 ENCOUNTER — Telehealth: Payer: Self-pay

## 2020-01-01 ENCOUNTER — Ambulatory Visit (INDEPENDENT_AMBULATORY_CARE_PROVIDER_SITE_OTHER): Payer: Medicaid Other | Admitting: *Deleted

## 2020-01-01 ENCOUNTER — Other Ambulatory Visit: Payer: Self-pay | Admitting: Family Medicine

## 2020-01-01 ENCOUNTER — Other Ambulatory Visit: Payer: Self-pay

## 2020-01-01 VITALS — BP 126/83 | HR 111

## 2020-01-01 DIAGNOSIS — Z5189 Encounter for other specified aftercare: Secondary | ICD-10-CM

## 2020-01-01 MED ORDER — OXYCODONE HCL 5 MG PO TABS: 5.0000 mg | ORAL_TABLET | Freq: Four times a day (QID) | ORAL | 0 refills | Status: DC | PRN

## 2020-01-01 NOTE — Telephone Encounter (Signed)
I called Tara Romero and notified her that Dr. Adrian Blackwater did approve a refill of her oxycodone and it has been sent to her pharmacy. Advised her to let us know if she has other issues. She voices understanding. Tommy Goostree,RN

## 2020-01-01 NOTE — Progress Notes (Signed)
Here for incision check . Reports is out of pain med ( oxycodone) and would like refill. States has incision pain and back pain( states stuck back 3 times for spinal) = 8-9 and ibuprofen not helping. Also cares for 28 year old. Has scoliosis. Informed her I will need to discuss refill with provider because we do not usually refill narcotics after c/s. I explained I will check with doctor who is not here and will get back to her later today.  BP first check a little high , had been talking. After sitting 5 minutes bp wnl. No edema noted. Incision clean , dry , intact. Advised to keep wound CDI and check it daily- call us if any opening, edema, redness, discharge. She voices understanding.  I also reviewed her pp appointment. Wilfred Dayrit,RN

## 2020-01-01 NOTE — Telephone Encounter (Signed)
Called pt in regards to her MyChart message and her missed appointment this am for incision check.  Pt stated that she missed her appointment because she was so tired.  I advised pt that it would be important that she come to the appt because we would be able to evaluate her with her pain to assess the need for more Oxycodone.  I asked pt if she would be able to come in today at 1400 for incision check.  Pt stated yes.  Pt did not have any other questions.   Addison Naegeli, RN

## 2020-01-01 NOTE — Progress Notes (Signed)
Chart reviewed - agree with CMA/RN documentation.  ° °

## 2020-01-05 LAB — SURGICAL PATHOLOGY

## 2020-01-27 ENCOUNTER — Other Ambulatory Visit: Payer: Self-pay

## 2020-01-27 ENCOUNTER — Encounter: Payer: Self-pay | Admitting: Advanced Practice Midwife

## 2020-01-27 ENCOUNTER — Ambulatory Visit (INDEPENDENT_AMBULATORY_CARE_PROVIDER_SITE_OTHER): Payer: Medicaid Other | Admitting: Advanced Practice Midwife

## 2020-01-27 ENCOUNTER — Other Ambulatory Visit (HOSPITAL_COMMUNITY)
Admission: RE | Admit: 2020-01-27 | Discharge: 2020-01-27 | Disposition: A | Discharge status: Home / Self Care | Payer: Medicaid Other | Source: Ambulatory Visit | Attending: Advanced Practice Midwife | Admitting: Advanced Practice Midwife

## 2020-01-27 VITALS — BP 120/81 | HR 85 | Wt 161.0 lb

## 2020-01-27 DIAGNOSIS — G8929 Other chronic pain: Secondary | ICD-10-CM

## 2020-01-27 DIAGNOSIS — Z1331 Encounter for screening for depression: Secondary | ICD-10-CM

## 2020-01-27 DIAGNOSIS — N898 Other specified noninflammatory disorders of vagina: Secondary | ICD-10-CM

## 2020-01-27 DIAGNOSIS — M545 Low back pain: Secondary | ICD-10-CM

## 2020-01-27 NOTE — Progress Notes (Signed)
Appointment Date: 01/27/2020  OBGYN Clinic: [facility]  Chief Complaint:  Postpartum Visit  History of Present Illness: Tara Romero is a 29 y.o. Caucasian [id number] (No LMP recorded.), seen for the above chief complaint. Her past medical history is significant for none   She is s/p repeat cesarean section on 12/24/2019 at 39 weeks; she was discharged to home on POD#2. Pregnancy complicated by none. Baby is doing well.  Complains of back pain. Started after 1st c-section, but then got better. Now having pain again. States that the pain is near the epidural site. Patient reports history of scoliosis. Patient was seen here on 01/01/2020 and given refill on oxycodone. Currently doing tylenol and ibuprofen.   Vaginal bleeding or discharge: spotting  Mode of feeding infant: Bottle, Breast and Gerber soothe  Intercourse: No  Contraception: bilateral tubal ligation PP depression s/s: No .  Any bowel or bladder issues: No  Pap smear: no abnormalities (date: 04/2018)  Review of Systems: Her 12 point review of systems is negative or as noted in the History of Present Illness.  Patient Active Problem List   Diagnosis Date Noted  . Unwanted fertility 12/24/2019  . Status post repeat low transverse cesarean section 12/24/2019  . Cesarean delivery delivered   . History of cesarean delivery 09/24/2019  . GBS bacteriuria 07/23/2019  . Supervision of high risk pregnancy, antepartum 06/02/2019  . History of gestational hypertension 05/15/2018    Medications Idolina Mantell. Servidio had no medications administered during this visit. Current Outpatient Medications  Medication Sig Dispense Refill  . acetaminophen (TYLENOL) 500 MG tablet Take 1,000 mg by mouth every 6 (six) hours as needed.    Marland Kitchen ibuprofen (ADVIL) 600 MG tablet Take 1 tablet (600 mg total) by mouth every 6 (six) hours. 30 tablet 0  . prenatal vitamin w/FE, FA (PRENATAL 1 + 1) 27-1 MG TABS tablet Take 1 tablet by mouth daily at 12 noon. 30 tablet 4    . oxyCODONE (OXY IR/ROXICODONE) 5 MG immediate release tablet Take 1 tablet (5 mg total) by mouth every 6 (six) hours as needed for moderate pain or severe pain. (Patient not taking: Reported on 01/27/2020) 20 tablet 0   No current facility-administered medications for this visit.    Allergies Patient has no known allergies.  Physical Exam:  Physical Exam Vitals and nursing note reviewed.  Constitutional:      Appearance: Normal appearance.  HENT:     Head: Normocephalic.  Cardiovascular:     Rate and Rhythm: Normal rate.  Pulmonary:     Effort: Pulmonary effort is normal.  Abdominal:     Palpations: Abdomen is soft.     Comments: Incision well healed  Skin:    General: Skin is warm and dry.  Neurological:     Mental Status: She is alert and oriented to person, place, and time.  Psychiatric:        Mood and Affect: Mood normal.        Behavior: Behavior normal.      PP Depression Screening:    Edinburgh Postnatal Depression Scale - 01/27/20 1007      Edinburgh Postnatal Depression Scale:  In the Past 7 Days   I have been able to laugh and see the funny side of things. 0    I have looked forward with enjoyment to things. 0    I have blamed myself unnecessarily when things went wrong. 0    I have been anxious or worried for no  good reason. 0    I have felt scared or panicky for no good reason. 0    Things have been getting on top of me. 0    I have been so unhappy that I have had difficulty sleeping. 0    I have felt sad or miserable. 0    I have been so unhappy that I have been crying. 0    The thought of harming myself has occurred to me. 0    Edinburgh Postnatal Depression Scale Total 0            Assessment:Patient is a 29 y.o. [id number] who is 4 weeks 6 days weeks postpartum from a repeat cesarean section.  She is doing well.   Plan:  Essential components of care per ACOG recommendations:  1.  Mood and well being: Patient with negative depression screening  today. Reviewed local resources for support.  - Patient does not use tobacco. - hx of drug use? No    2. Infant care and feeding:  -Patient currently breastmilk feeding? No If breastmilk feeding discussed return to work and pumping. If needed, patient was provided letter for work to allow for every 2-3 hr pumping breaks, and to be granted a private location to express breastmilk and refrigerated area to store breastmilk. Reviewed importance of draining breast regularly to support lactation. -Social determinants of health (SDOH) reviewed in EPIC. No concerns  3. Sexuality, contraception and birth spacing - Patient does not want a pregnancy in the next year.  Desired family size is 2 children.  - Reviewed forms of contraception in tiered fashion. Patient desired bilateral tubal ligation today.   - Discussed birth spacing of 18 months  4. Sleep and fatigue -Encouraged family/partner/community support of 4 hrs of uninterrupted sleep to help with mood and fatigue  5. Physical Recovery  - Discussed patients delivery and complications - Patient had a NA degree laceration, perineal healing reviewed. Patient expressed understanding - Patient has urinary incontinence? No Patient was referred to PT for back pain  - Patient is safe to resume physical and sexual activity  6.  Health Maintenance - Last pap smear done 04/2018 and was normal with negative HPV. NA  Mammogram  7. Chronic Disease - Back pain, continue flexeril/ibuprofen  - PT referral sent for patient to start PT   Thressa Sheller DNP, CNM  01/27/20  10:43 AM  Center for [employer], [facility]

## 2020-01-27 NOTE — Addendum Note (Signed)
Addended by: Maxwell Marion E on: 01/27/2020 01:51 PM   Modules accepted: Orders

## 2020-01-28 LAB — CERVICOVAGINAL ANCILLARY ONLY
Bacterial Vaginitis (gardnerella): POSITIVE — AB
Candida Glabrata: NEGATIVE
Candida Vaginitis: NEGATIVE
Chlamydia: NEGATIVE
Comment: NEGATIVE
Comment: NEGATIVE
Comment: NEGATIVE
Comment: NEGATIVE
Comment: NEGATIVE
Comment: NORMAL
Neisseria Gonorrhea: NEGATIVE
Trichomonas: NEGATIVE

## 2020-01-29 ENCOUNTER — Other Ambulatory Visit: Payer: Self-pay | Admitting: *Deleted

## 2020-01-29 ENCOUNTER — Encounter: Payer: Self-pay | Admitting: Advanced Practice Midwife

## 2020-01-29 DIAGNOSIS — N760 Acute vaginitis: Secondary | ICD-10-CM

## 2020-01-29 MED ORDER — METRONIDAZOLE 500 MG PO TABS: 500.0000 mg | ORAL_TABLET | Freq: Two times a day (BID) | ORAL | 0 refills | Status: DC

## 2020-01-29 NOTE — Progress Notes (Signed)
Orders for BV ( per wet prep results) sent in per protocol  Ichelle Harral,RN

## 2020-02-13 ENCOUNTER — Encounter: Payer: Self-pay | Admitting: Advanced Practice Midwife

## 2020-07-05 ENCOUNTER — Encounter: Payer: Self-pay | Admitting: General Practice

## 2021-05-19 IMAGING — US US OB < 14 WEEKS - US OB TV
1 series · 15 of 28 positions shown · non-contrast
Comparison: None.

CLINICAL DATA: Pregnant patient with vaginal bleeding.

EXAM:
OBSTETRIC <14 WK US AND TRANSVAGINAL OB US
TECHNIQUE: Both transabdominal and transvaginal ultrasound examinations were
performed for complete evaluation of the gestation as well as the
maternal uterus, adnexal regions, and pelvic cul-de-sac.
Transvaginal technique was performed to assess early pregnancy.

[Series 1: us ob < 14 weeks - us ob tv · 15 of 46 slices shown]
[im 1/46]
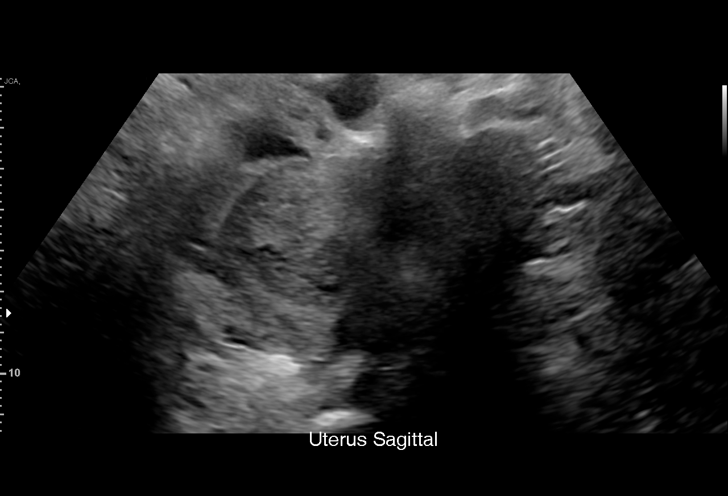
[im 4/46]
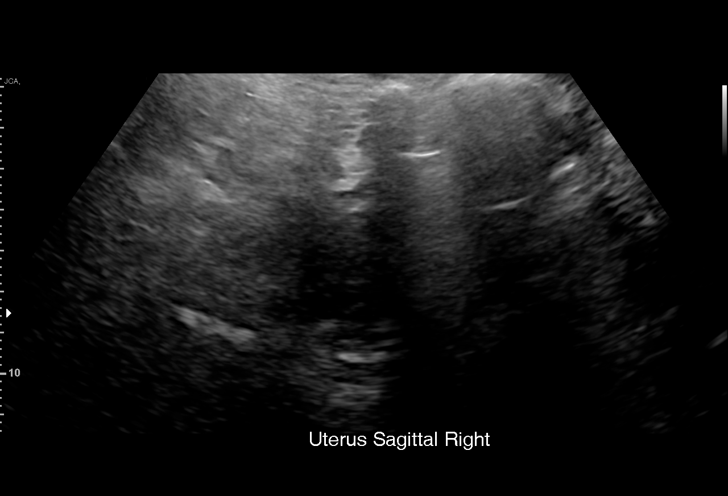
[im 7/46]
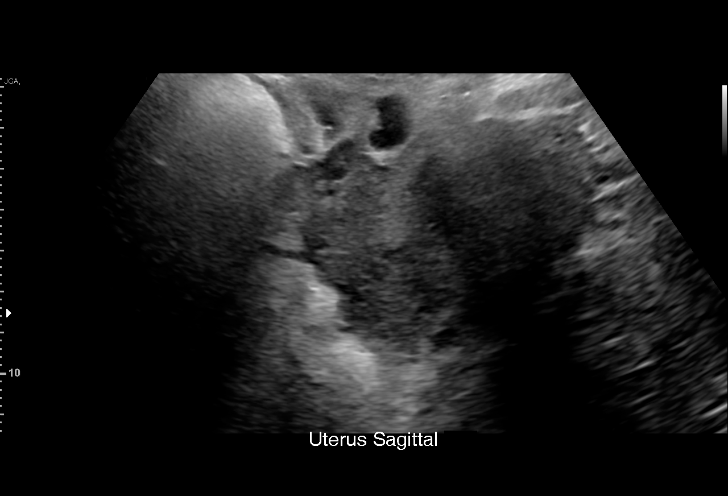
[im 11/46]
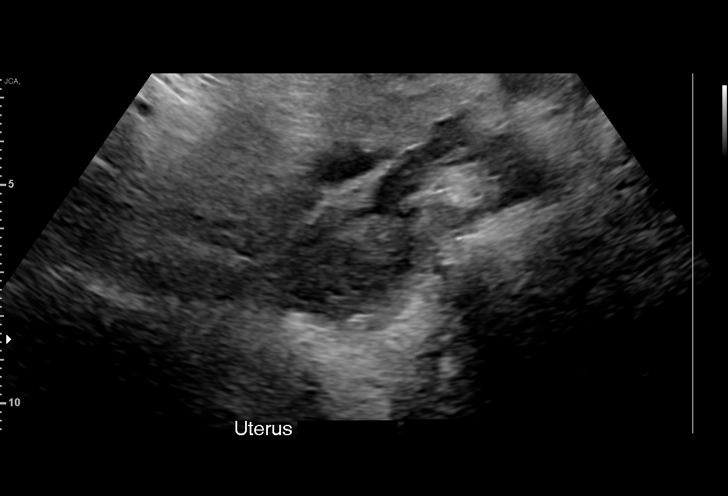
[im 14/46]
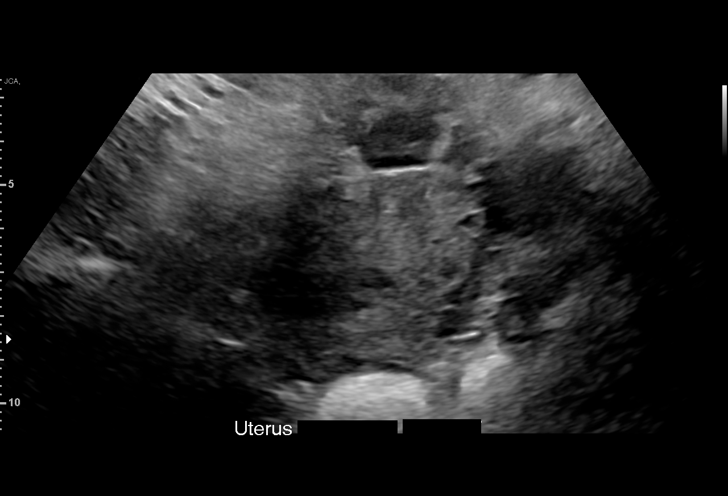
[im 17/46]
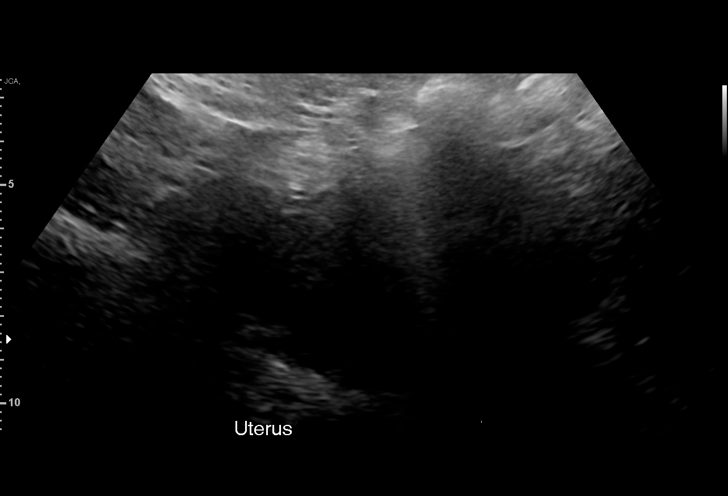
[im 21/46]
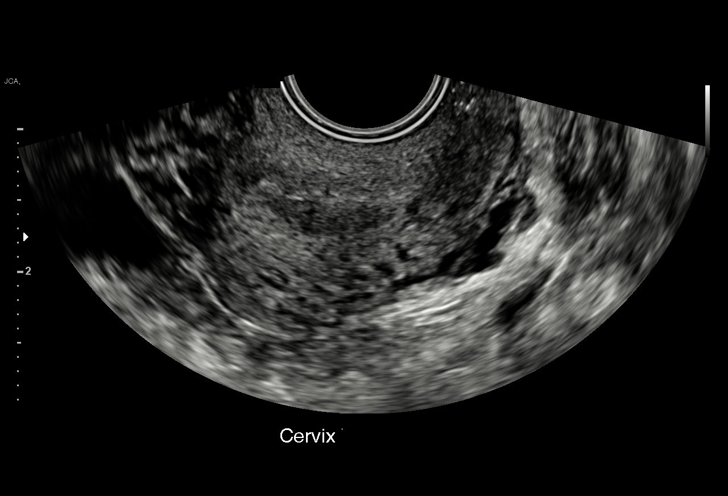
[im 24/46]
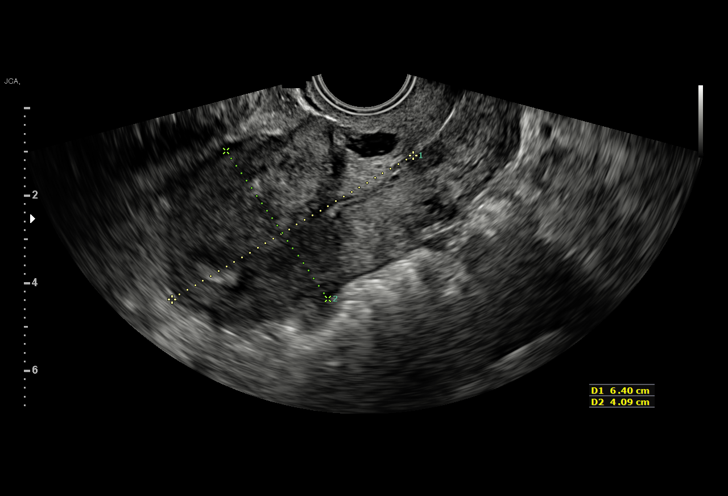
[im 26/46]
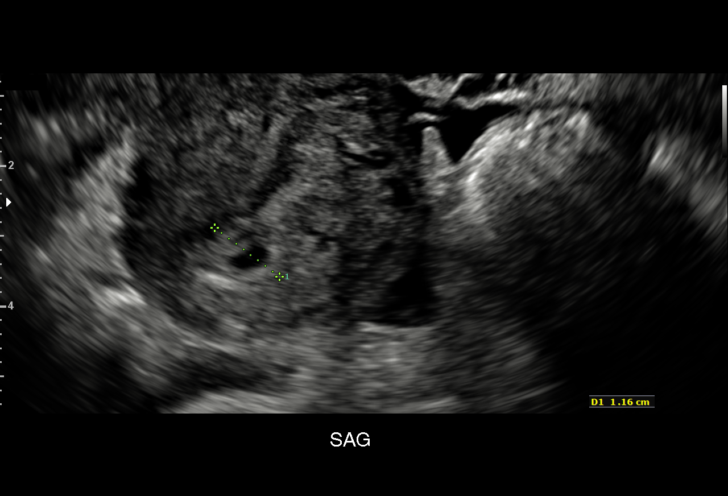
[im 29/46]
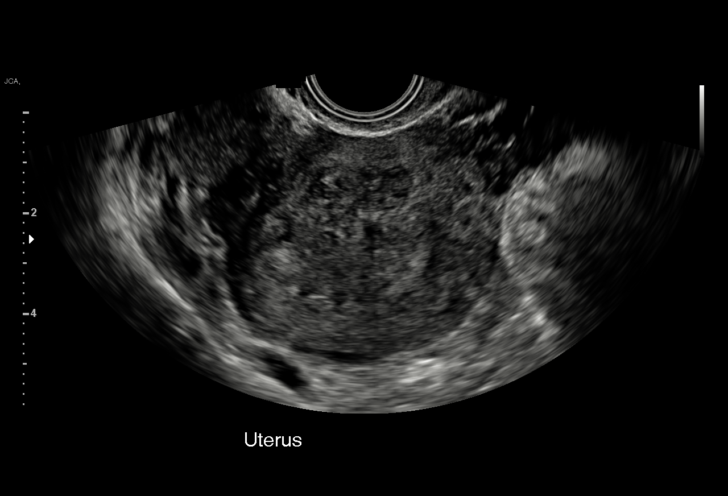
[im 32/46]
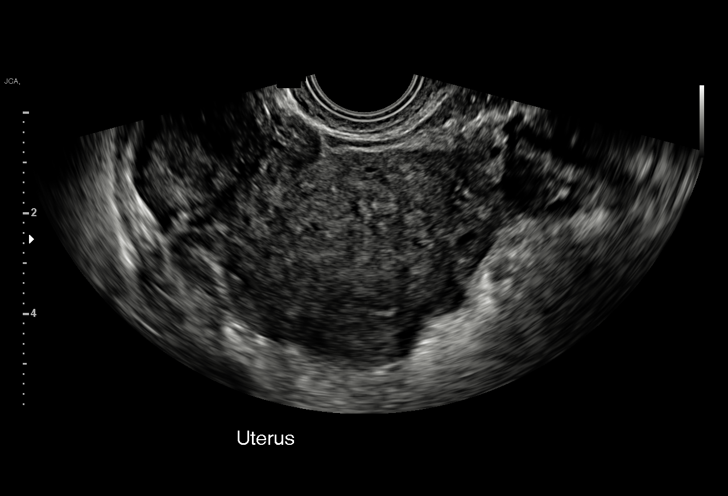
[im 36/46]
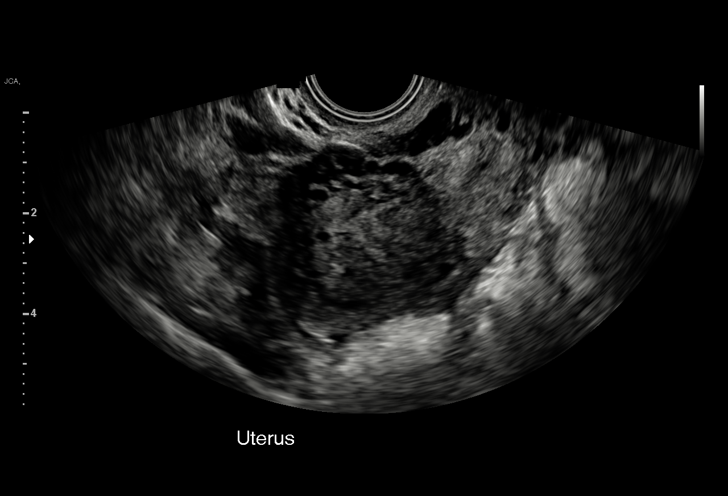
[im 39/46]
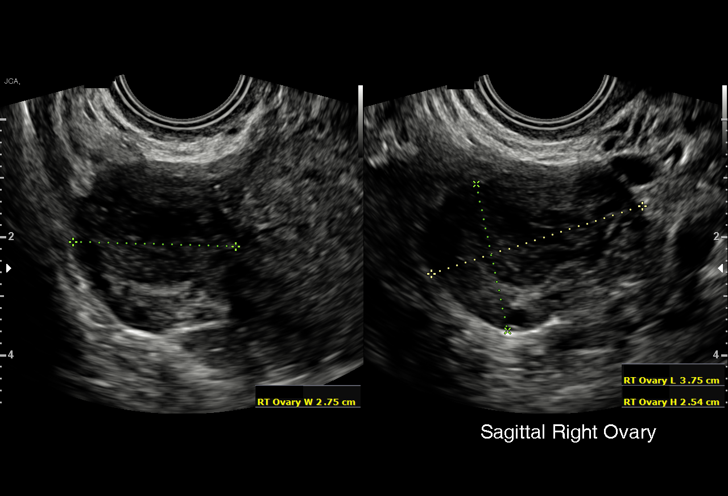
[im 42/46]
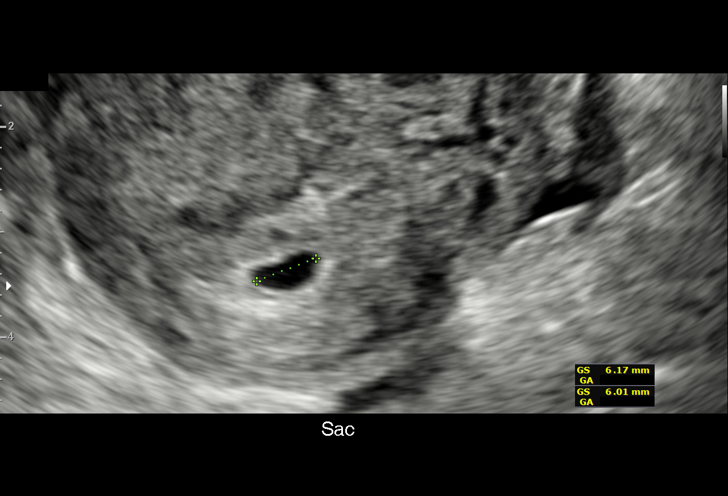
[im 46/46]
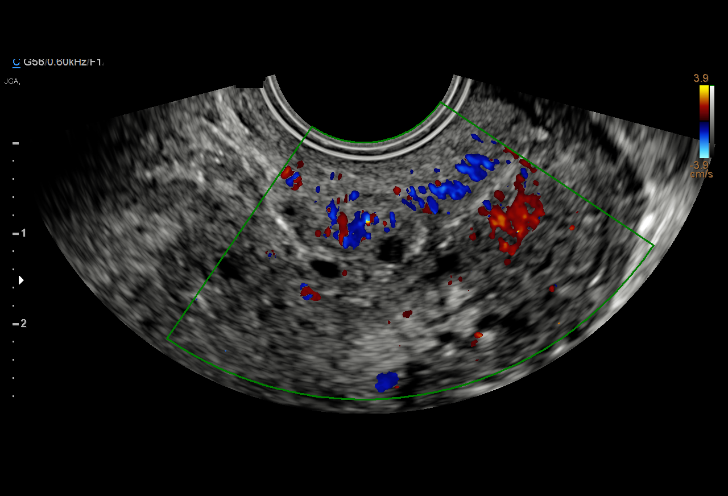

[15 of 28 positions shown; findings below may reference images not displayed]

FINDINGS: Intrauterine gestational sac: Single

Yolk sac:  Not Visualized.

Embryo:  Not Visualized.

Cardiac Activity: Not Visualized.

MSD: 4.6 mm   5 w   1 d

Subchorionic hemorrhage:  None visualized.

Maternal uterus/adnexae: Right ovarian corpus luteum. Multiple
nabothian cysts. Normal appearance of the left ovary.
IMPRESSION: Probable early intrauterine gestational sac, but no yolk sac, fetal
pole, or cardiac activity yet visualized. Recommend follow-up
quantitative B-HCG levels and follow-up US in 14 days to assess
viability. This recommendation follows SRU consensus guidelines:
Diagnostic Criteria for Nonviable Pregnancy Early in the First
Trimester. N Engl J Med 8451; [DATE].

## 2021-06-16 IMAGING — US US OB TRANSVAGINAL
1 series · 15 of 28 positions shown · non-contrast
Comparison: Prior ultrasound from 05/21/2019.

CLINICAL DATA: Initial evaluation for acute vaginal bleeding, early
pregnancy.

EXAM:
TRANSVAGINAL OB ULTRASOUND
TECHNIQUE: Transvaginal ultrasound was performed for complete evaluation of the
gestation as well as the maternal uterus, adnexal regions, and
pelvic cul-de-sac.

[Series 1: us ob transvaginal · 15 of 32 slices shown]
[im 1/32]
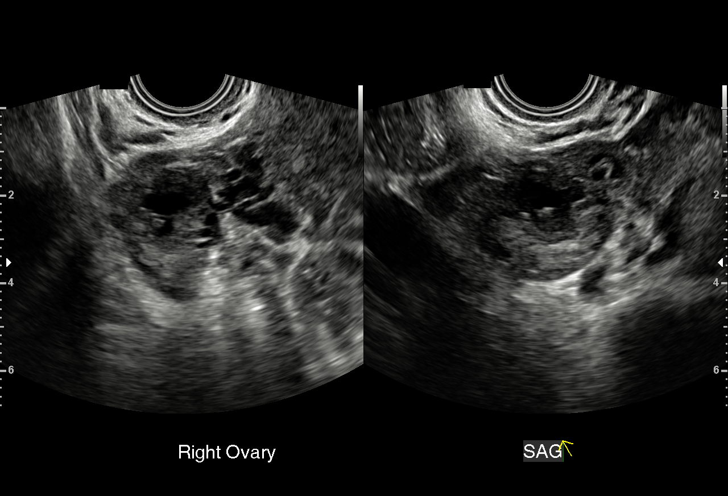
[im 3/32]
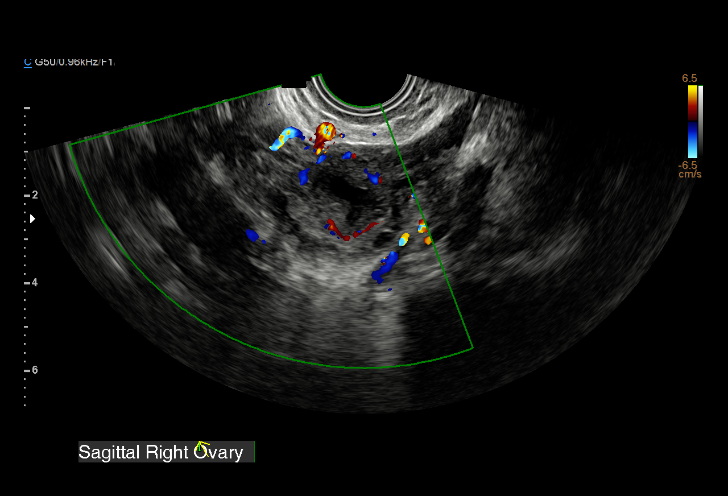
[im 5/32]
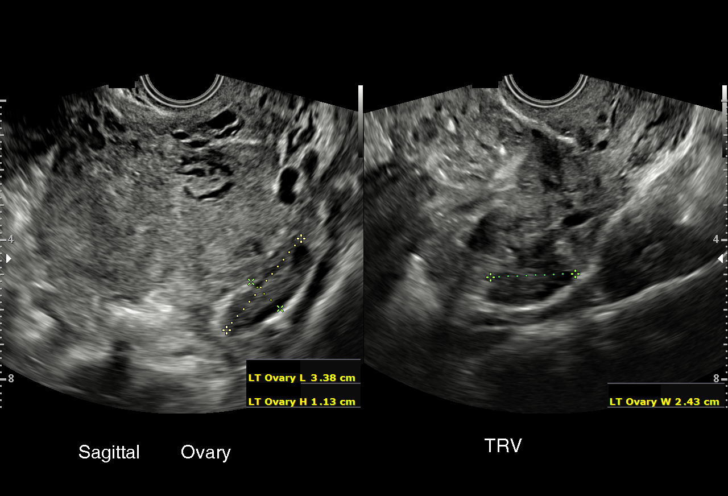
[im 7/32]
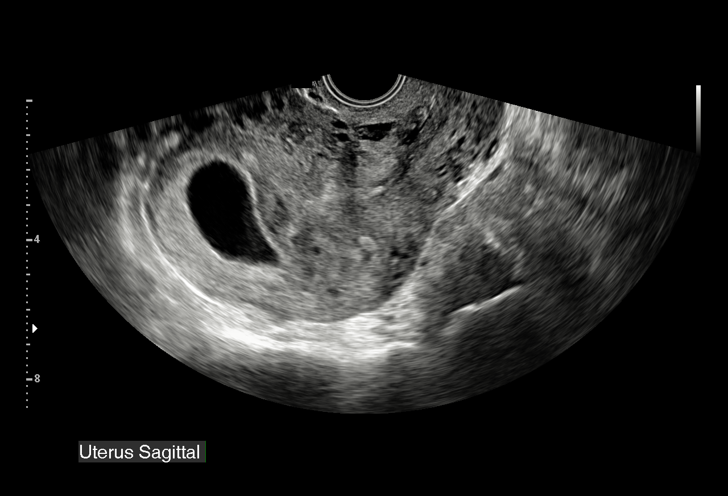
[im 10/32]
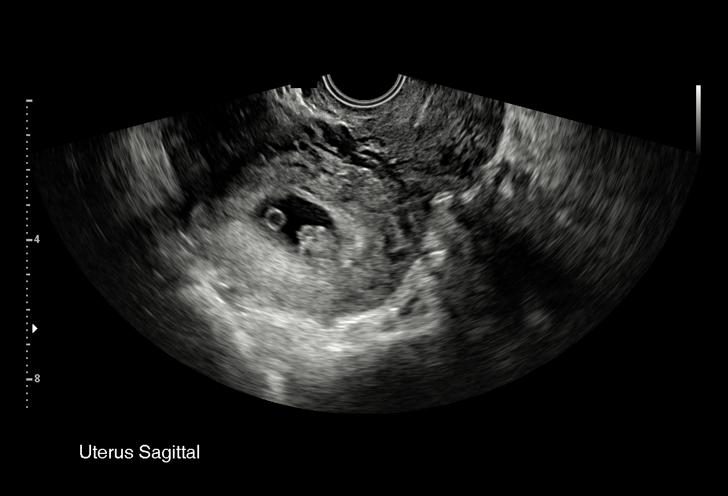
[im 12/32]
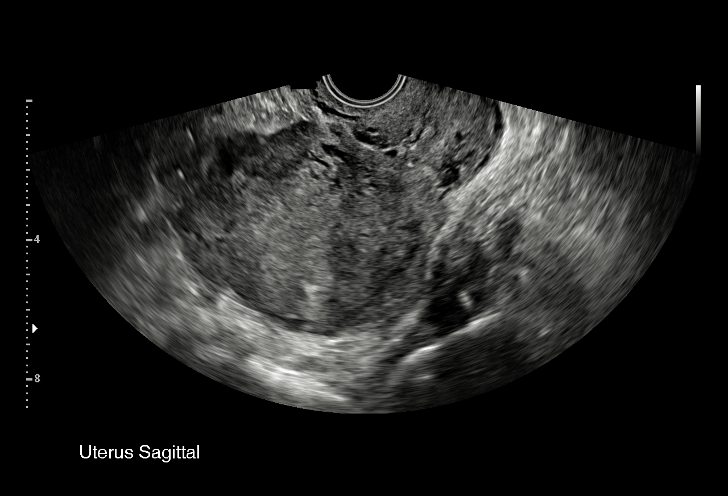
[im 14/32]
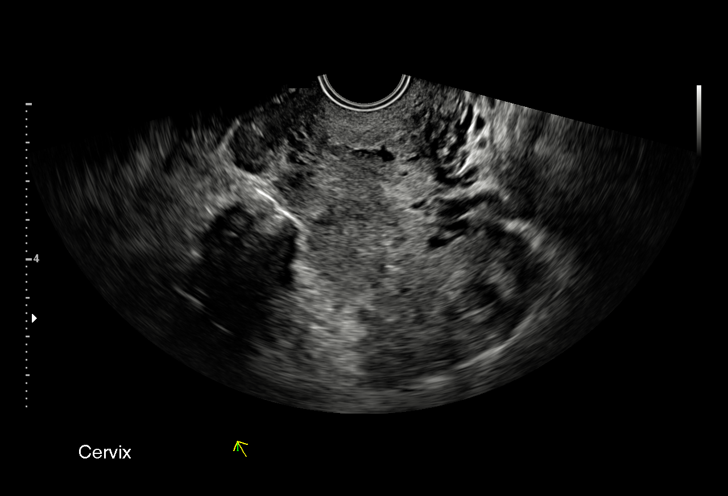
[im 17/32]
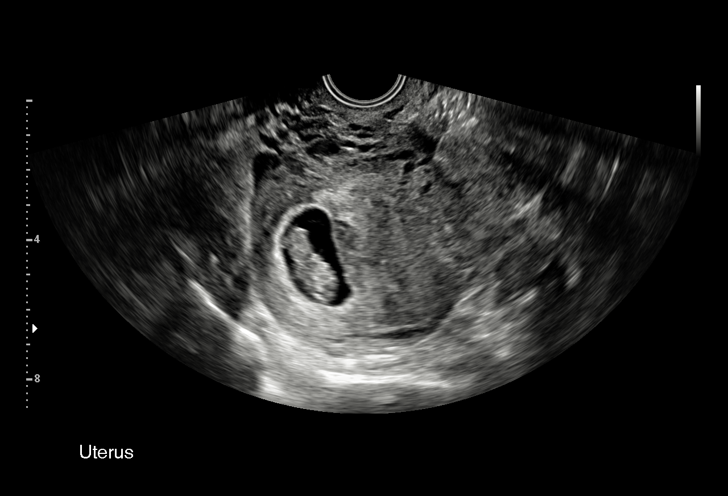
[im 18/32]
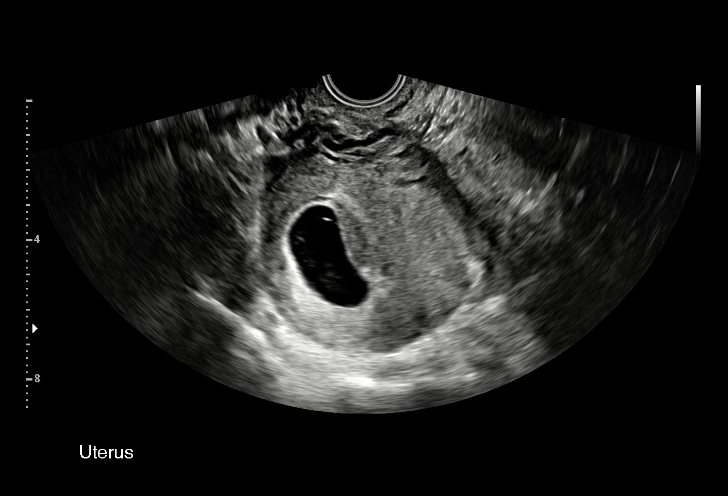
[im 20/32]
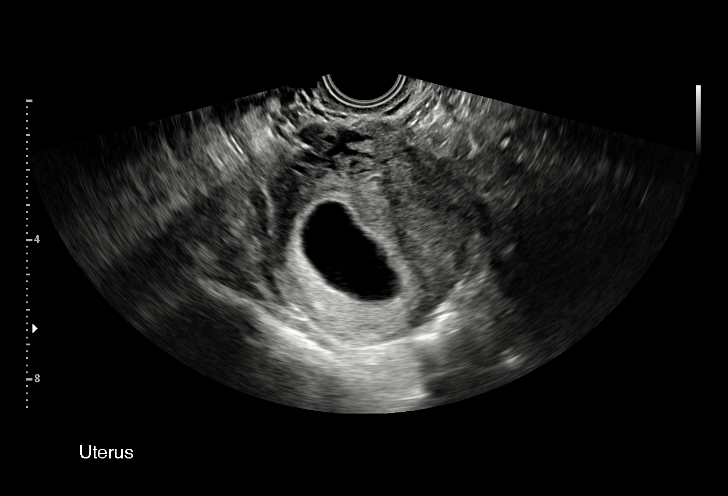
[im 22/32]
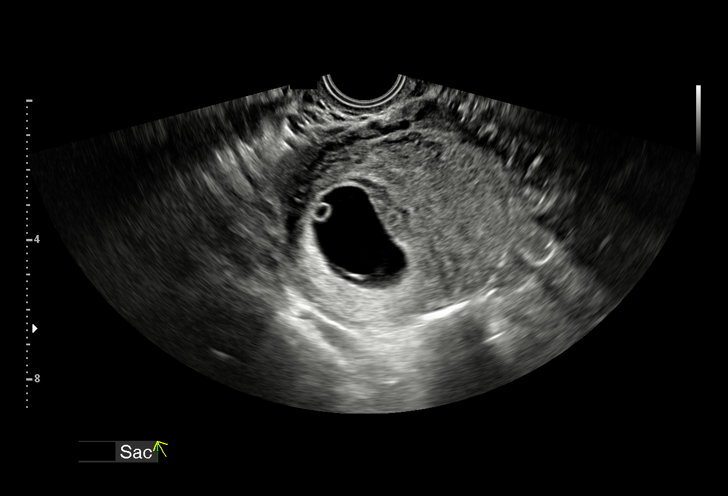
[im 25/32]
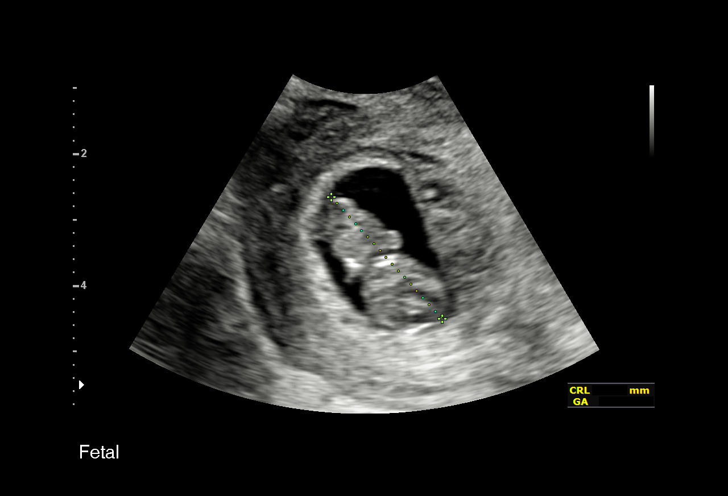
[im 27/32]
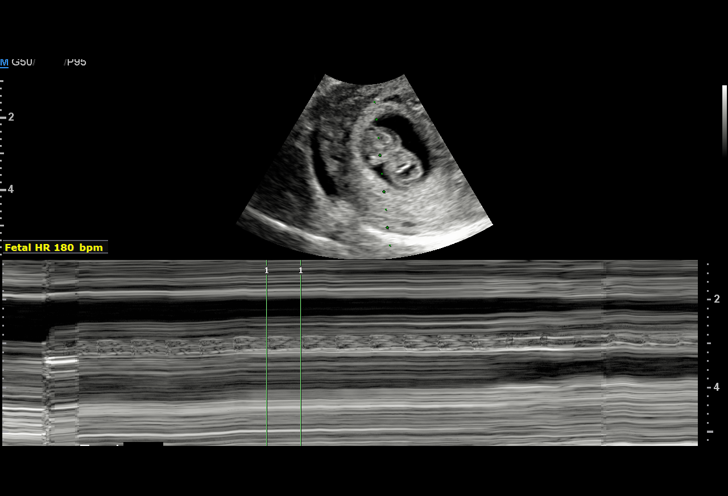
[im 29/32]
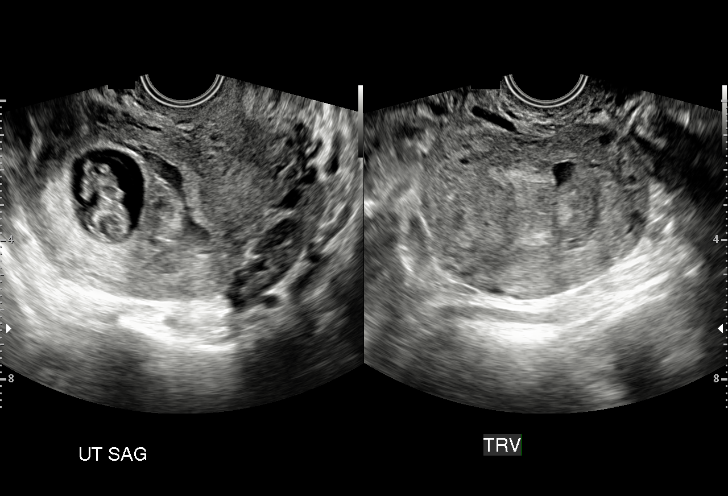
[im 32/32]
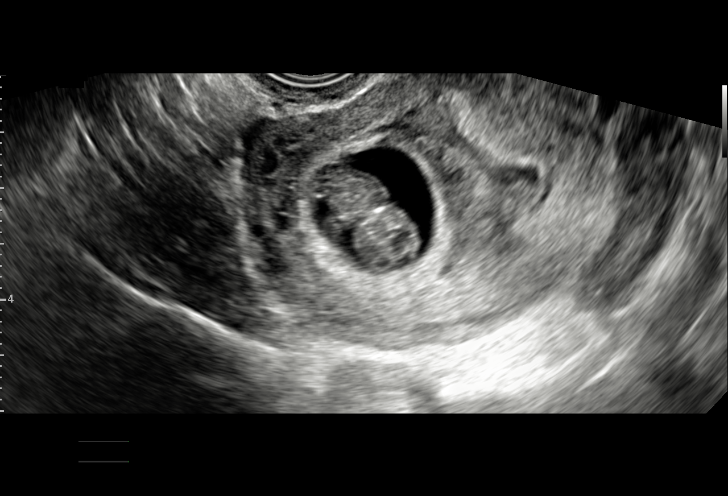

[15 of 28 positions shown; findings below may reference images not displayed]

FINDINGS: Intrauterine gestational sac: Single

Yolk sac:  Present

Embryo:  Present

Cardiac Activity: Present

Heart Rate: 180 bpm

CRL: 24.6 mm   9 w 1 d                  US EDC: 12/31/2019

Subchorionic hemorrhage: Small subchorionic hemorrhage measuring
x 0.8 x 0.6 cm without associated mass effect.

Maternal uterus/adnexae: Ovaries are normal in appearance
bilaterally. Small corpus luteal cyst noted on the right. No adnexal
mass or free fluid.
IMPRESSION: 1. Single viable intrauterine pregnancy, estimated gestational age 9
weeks and 1 day by crown-rump length, with ultrasound EDC of
12/31/2019.
2. Small subchorionic hemorrhage without associated mass effect.
3. No other acute maternal uterine or adnexal abnormality
identified.

## 2021-08-25 ENCOUNTER — Encounter: Payer: Self-pay | Admitting: Physician Assistant

## 2021-08-25 ENCOUNTER — Other Ambulatory Visit: Payer: Self-pay

## 2021-08-25 ENCOUNTER — Ambulatory Visit: Payer: Medicaid Other | Admitting: Physician Assistant

## 2021-08-25 DIAGNOSIS — Z7689 Persons encountering health services in other specified circumstances: Secondary | ICD-10-CM | POA: Diagnosis not present

## 2021-08-25 DIAGNOSIS — R5383 Other fatigue: Secondary | ICD-10-CM

## 2021-08-25 DIAGNOSIS — F411 Generalized anxiety disorder: Secondary | ICD-10-CM

## 2021-08-25 DIAGNOSIS — E559 Vitamin D deficiency, unspecified: Secondary | ICD-10-CM

## 2021-08-25 MED ORDER — SERTRALINE HCL 25 MG PO TABS: 25.0000 mg | ORAL_TABLET | Freq: Every day | ORAL | 3 refills | Status: DC

## 2021-08-25 NOTE — Progress Notes (Signed)
[facility] [address] [place], [place] [address]  Internal MEDICINE  Office Visit Note  Patient Name: Tara Romero  [id number]  [id number]  Date of Service: 08/30/2021   Complaints/HPI Pt is here for establishment of PCP. Chief Complaint  Patient presents with   New Patient (Initial Visit)   HPI Pt is here to establish care -Does have 2 kids--30yo and 6yo -hyperactive, always needs to do something, cant relax at night. Can sleep fine but then ready to be up and doing things. 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 Feels some need to have things perfect and describes some OCD tendencies.   Current Medication: Outpatient Encounter Medications as of 08/25/2021  Medication Sig   sertraline (ZOLOFT) 25 MG tablet Take 1 tablet (25 mg total) by mouth daily.   acetaminophen (TYLENOL) 500 MG tablet Take 1,000 mg by mouth every 6 (six) hours as needed. (Patient not taking: Reported on 08/25/2021)   ibuprofen (ADVIL) 600 MG tablet Take 1 tablet (600 mg total) by mouth every 6 (six) hours. (Patient not taking: Reported on 08/25/2021)   metroNIDAZOLE (FLAGYL) 500 MG tablet Take 1 tablet (500 mg total) by mouth 2 (two) times daily. (Patient not taking: Reported on 08/25/2021)   oxyCODONE (OXY IR/ROXICODONE) 5 MG immediate release tablet Take 1 tablet (5 mg total) by mouth every 6 (six) hours as needed for moderate pain or severe pain. (Patient not  taking: Reported on 08/25/2021)   prenatal vitamin w/FE, FA (PRENATAL 1 + 1) 27-1 MG TABS tablet Take 1 tablet by mouth daily at 12 noon. (Patient not taking: Reported on 08/25/2021)   No facility-administered encounter medications on file as of 08/25/2021.    Surgical History: Past Surgical History:  Procedure Laterality Date   CESAREAN SECTION N/A 04/08/2015   Procedure: CESAREAN SECTION;  Surgeon: Vena Austria, MD;  Location: [facility] ORS;  Service: Obstetrics;  Laterality: N/A;   CESAREAN SECTION WITH BILATERAL TUBAL LIGATION N/A 12/24/2019   Procedure: CESAREAN SECTION WITH BILATERAL SALPINGECTOMY;  Surgeon: Kathrynn Running, MD;  Location: [facility];  Service: Obstetrics;  Laterality: N/A;   DILATION AND EVACUATION N/A 06/25/2018   Procedure: DILATATION AND EVACUATION;  Surgeon: Nadara Mustard, MD;  Location: [facility] OR[facility];  Service: Gynecology;  Laterality: N/A;    Medical History: Past Medical History:  Diagnosis Date   Missed ab 05/29/2018    Family History: History reviewed. No pertinent family history.  Social History   Socioeconomic History   Marital status: Single    Spouse name: Not on file   Number of children: Not on file   Years of education: Not on file   Highest education level: Not on file  Occupational History   Not on file  Tobacco Use   Smoking status: Never   Smokeless tobacco: Never  Vaping Use   Vaping Use: Never used  Substance  and Sexual Activity   Alcohol use: No   Drug use: No   Sexual activity: Yes    Partners: Male    Birth control/protection: None  Other Topics Concern   Not on file  Social History Narrative   Not on file   Social Determinants of Health   Financial Resource Strain: Not on file  Food Insecurity: Not on file  Transportation Needs: Not on file  Physical Activity: Not on file  Stress: Not on file  Social Connections: Not on file  Intimate Partner Violence: Not on file     Review of Systems  Constitutional:  Negative  for chills, fatigue and unexpected weight change.  HENT:  Negative for congestion, postnasal drip, rhinorrhea, sneezing and sore throat.   Eyes:  Negative for redness.  Respiratory:  Negative for cough, chest tightness and shortness of breath.   Cardiovascular:  Negative for chest pain and palpitations.  Gastrointestinal:  Negative for abdominal pain, constipation, diarrhea, nausea and vomiting.  Genitourinary:  Negative for dysuria and frequency.  Musculoskeletal:  Negative for arthralgias, back pain, joint swelling and neck pain.  Skin:  Negative for rash.  Neurological: Negative.  Negative for tremors and numbness.  Hematological:  Negative for adenopathy. Does not bruise/bleed easily.  Psychiatric/Behavioral:  Negative for behavioral problems (Depression), sleep disturbance and suicidal ideas. The patient is nervous/anxious and is hyperactive.    Vital Signs: BP 109/82    Pulse 87    Temp 98.4 F (36.9 C)    Resp 16    Ht 5\' 6"  (1.676 m)    Wt 158 lb 9.6 oz (71.9 kg)    LMP 08/01/2021 (Exact Date)    SpO2 98%    Breastfeeding No    BMI 25.60 kg/m    Physical Exam Vitals and nursing note reviewed.  Constitutional:      General: She is not in acute distress.    Appearance: She is well-developed and normal weight. She is not diaphoretic.  HENT:     Head: Normocephalic and atraumatic.     Mouth/Throat:     Pharynx: No oropharyngeal exudate.  Eyes:     Pupils: Pupils are equal, round, and reactive to light.  Neck:     Thyroid: No thyromegaly.     Vascular: No JVD.     Trachea: No tracheal deviation.  Cardiovascular:     Rate and Rhythm: Normal rate and regular rhythm.     Heart sounds: Normal heart sounds. No murmur heard.   No friction rub. No gallop.  Pulmonary:     Effort: Pulmonary effort is normal. No respiratory distress.     Breath sounds: No wheezing or rales.  Chest:     Chest wall: No tenderness.  Abdominal:     General: Bowel sounds are normal.     Palpations:  Abdomen is soft.  Musculoskeletal:        General: Normal range of motion.     Cervical back: Normal range of motion and neck supple.  Lymphadenopathy:     Cervical: No cervical adenopathy.  Skin:    General: Skin is warm and dry.  Neurological:     Mental Status: She is alert and oriented to person, place, and time.     Cranial Nerves: No cranial nerve deficit.  Psychiatric:        Behavior: Behavior normal.        Thought Content: Thought content normal.        Judgment: Judgment normal.  Assessment/Plan: 1. GAD (generalized anxiety disorder) Will start on zoloft and may need to titrate up to help with anxiety. Did discuss she may need/benefit from counseling in future to help with anxiety and possible OCD.  - sertraline (ZOLOFT) 25 MG tablet; Take 1 tablet (25 mg total) by mouth daily.  Dispense: 30 tablet; Refill: 3  2. Encounter to establish care with new doctor Here to establish care and will order labs prior to CPE  3. Other fatigue - CBC w/Diff/Platelet - Comprehensive metabolic panel - Lipid Panel With LDL/HDL Ratio - TSH + free T4   General Counseling: Tara Romero verbalizes understanding of the findings of todays visit and agrees with plan of treatment. I have discussed any further diagnostic evaluation that may be needed or ordered today. We also reviewed her medications today. she has been encouraged to call the office with any questions or concerns that should arise related to todays visit.    Counseling:    Orders Placed This Encounter  Procedures   CBC w/Diff/Platelet   Comprehensive metabolic panel   Lipid Panel With LDL/HDL Ratio   TSH + free T4    Meds ordered this encounter  Medications   sertraline (ZOLOFT) 25 MG tablet    Sig: Take 1 tablet (25 mg total) by mouth daily.    Dispense:  30 tablet    Refill:  3     This patient was seen by Lynn ItoLauren Len Azeez, PA-C in collaboration with Dr. Beverely RisenFozia Khan as a part of collaborative care  agreement.   Time spent:35 Minutes

## 2021-09-22 ENCOUNTER — Ambulatory Visit: Payer: Medicaid Other | Admitting: Physician Assistant

## 2024-04-30 ENCOUNTER — Encounter: Payer: Self-pay | Admitting: Physician Assistant

## 2024-05-01 ENCOUNTER — Ambulatory Visit: Admitting: Physician Assistant

## 2024-05-01 ENCOUNTER — Encounter: Payer: Self-pay | Admitting: Physician Assistant

## 2024-05-01 VITALS — BP 128/70 | HR 99 | Temp 98.0°F | Resp 16 | Ht 65.0 in | Wt 150.8 lb

## 2024-05-01 DIAGNOSIS — G8929 Other chronic pain: Secondary | ICD-10-CM

## 2024-05-01 DIAGNOSIS — M545 Low back pain, unspecified: Secondary | ICD-10-CM

## 2024-05-01 NOTE — Progress Notes (Signed)
 [facility] [address] 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 Midline and just left and right of midline -previously was given pain meds that helped but then it wore off and came back. Chiropractor made it feel worse a few years ago, even when 3x per week and didn't help. Then did PT and it didn't help -can bother her if driving more than 30mins -she is interested in seeing interventional pain clinc to see if injections may help  Current Medication: Outpatient Encounter Medications as of 05/01/2024  Medication Sig   acetaminophen  (TYLENOL ) 500 MG tablet Take 1,000 mg by mouth every 6 (six) hours as needed.   ibuprofen  (ADVIL ) 600 MG tablet Take 1 tablet (600 mg total) by mouth every 6 (six) hours.   [DISCONTINUED] metroNIDAZOLE  (FLAGYL ) 500 MG tablet Take 1 tablet (500 mg total) by mouth 2 (two) times daily.   [DISCONTINUED] oxyCODONE  (OXY IR/ROXICODONE ) 5 MG immediate release tablet Take 1 tablet (5 mg total) by mouth every 6 (six) hours as needed for moderate pain or severe pain.   [DISCONTINUED] prenatal vitamin w/FE, FA (PRENATAL 1 + 1) 27-1 MG TABS tablet Take 1 tablet by mouth daily at 12 noon.   [DISCONTINUED] sertraline  (ZOLOFT ) 25 MG tablet Take 1 tablet (25 mg total) by mouth daily.   No facility-administered encounter medications on file as of 05/01/2024.    Surgical History: Past Surgical History:   Procedure Laterality Date   CESAREAN SECTION N/A 04/08/2015   Procedure: CESAREAN SECTION;  Surgeon: Glory High, MD;  Location: [facility] ORS;  Service: Obstetrics;  Laterality: N/A;   CESAREAN SECTION WITH BILATERAL TUBAL LIGATION N/A 12/24/2019   Procedure: CESAREAN SECTION WITH BILATERAL SALPINGECTOMY;  Surgeon: Kandis Devaughn Sayres, MD;  Location: [facility];  Service: Obstetrics;  Laterality: N/A;   DILATION AND EVACUATION N/A 06/25/2018   Procedure: DILATATION AND EVACUATION;  Surgeon: Arloa Lamar SQUIBB, MD;  Location: [facility] OR[facility];  Service: Gynecology;  Laterality: N/A;    Medical History: Past Medical History:  Diagnosis Date   Missed ab 05/29/2018    Family History: History reviewed. No pertinent family history.  Social History   Socioeconomic History   Marital status: Single    Spouse name: Not on file   Number of children: Not on file   Years of education: Not on file   Highest education level: Not on file  Occupational History   Not on file  Tobacco Use   Smoking status: Never   Smokeless tobacco: Never  Vaping Use   Vaping status: Never Used  Substance and Sexual Activity   Alcohol use: No   Drug use: No   Sexual activity: Yes    Partners: Male    Birth control/protection: None  Other Topics Concern   Not on file  Social History Narrative   Not on file   Social Drivers  of Health   Financial Resource Strain: Not on file  Food Insecurity: No Food Insecurity (12/16/2019)   Hunger Vital Sign    Worried About Running Out of Food in the Last Year: Never true    Ran Out of Food in the Last Year: Never true  Transportation Needs: No Transportation Needs (12/16/2019)   PRAPARE - Administrator, Civil Service (Medical): No    Lack of Transportation (Non-Medical): No  Physical Activity: Not on file  Stress: Not on file  Social Connections: Not on file  Intimate Partner Violence: Not on file      Review of Systems  Constitutional:  Negative for chills  and fatigue.  HENT:  Negative for ear pain, postnasal drip and sinus pressure.   Eyes:  Negative for photophobia and redness.  Respiratory:  Negative for cough, shortness of breath and wheezing.   Cardiovascular:  Negative for chest pain, palpitations and leg swelling.  Gastrointestinal:  Negative for constipation, diarrhea and nausea.  Genitourinary:  Negative for dysuria and flank pain.  Musculoskeletal:  Positive for back pain. Negative for arthralgias and gait problem.  Skin:  Negative for color change.  Allergic/Immunologic: Negative for environmental allergies and food allergies.  Neurological:  Negative for dizziness and headaches.  Hematological:  Does not bruise/bleed easily.  Psychiatric/Behavioral:  Negative for agitation, behavioral problems (depression) and hallucinations.     Vital Signs: BP 128/70   Pulse 99   Temp 98 F (36.7 C)   Resp 16   Ht 5' 5 (1.651 m)   Wt 150 lb 12.8 oz (68.4 kg)   SpO2 99%   BMI 25.09 kg/m    Physical Exam Vitals and nursing note reviewed.  Constitutional:      Appearance: Normal appearance.  HENT:     Head: Normocephalic and atraumatic.  Eyes:     Extraocular Movements: Extraocular movements intact.  Cardiovascular:     Rate and Rhythm: Normal rate and regular rhythm.  Pulmonary:     Effort: Pulmonary effort is normal.     Breath sounds: Normal breath sounds.  Musculoskeletal:        General: No tenderness or deformity.  Skin:    General: Skin is warm and dry.  Neurological:     Mental Status: She is alert and oriented to person, place, and time.  Psychiatric:        Mood and Affect: Mood normal.        Behavior: Behavior normal.        Assessment/Plan: 1. Chronic midline low back pain without sciatica (Primary) Will refer to pain clinic for further evaluations and possible injections - Ambulatory referral to Pain Clinic   General Counseling: elmira olkowski understanding of the findings of todays visit and  agrees with plan of treatment. I have discussed any further diagnostic evaluation that may be needed or ordered today. We also reviewed her medications today. she has been encouraged to call the office with any questions or concerns that should arise related to todays visit.    Orders Placed This Encounter  Procedures   Ambulatory referral to Pain Clinic    No orders of the defined types were placed in this encounter.   This patient was seen by Tinnie Pro, PA-C in collaboration with Dr. Sigrid Bathe as a part of collaborative care agreement.   Total time spent:25 Minutes Time spent includes review of chart, medications, test results, and follow up plan with the patient.  Dr Fozia M Khan Internal medicine

## 2024-05-02 ENCOUNTER — Telehealth: Payer: Self-pay | Admitting: Physician Assistant

## 2024-05-02 NOTE — Telephone Encounter (Signed)
 Awaiting 05/01/24 office notes for pain mgmt referral-Toni

## 2024-05-12 ENCOUNTER — Telehealth: Payer: Self-pay | Admitting: Physician Assistant

## 2024-05-12 NOTE — Telephone Encounter (Signed)
 Pain Management referral faxed to [facility] per patient request; [phone]. Patient aware-Tara Romero

## 2024-06-24 ENCOUNTER — Ambulatory Visit: Admitting: Physical Medicine and Rehabilitation

## 2024-07-18 ENCOUNTER — Telehealth: Payer: Self-pay

## 2024-07-18 NOTE — Telephone Encounter (Signed)
 Pt called that she think is fungal infection in her ear and it is itching advised her to go to urgent care

## 2024-08-08 ENCOUNTER — Telehealth: Payer: Self-pay | Admitting: Internal Medicine

## 2024-08-08 NOTE — Telephone Encounter (Signed)
 Lvm to move 08/11/24 appointment to earlier slot-tw

## 2024-08-11 ENCOUNTER — Ambulatory Visit: Admitting: Internal Medicine

## 2024-08-12 ENCOUNTER — Encounter: Payer: Self-pay | Admitting: Internal Medicine

## 2024-08-12 ENCOUNTER — Ambulatory Visit: Admitting: Internal Medicine

## 2024-08-12 VITALS — BP 124/70 | HR 78 | Temp 97.6°F | Resp 16 | Ht 65.0 in | Wt 139.4 lb

## 2024-08-12 DIAGNOSIS — L03031 Cellulitis of right toe: Secondary | ICD-10-CM | POA: Diagnosis not present

## 2024-08-12 DIAGNOSIS — Z79899 Other long term (current) drug therapy: Secondary | ICD-10-CM | POA: Diagnosis not present

## 2024-08-12 DIAGNOSIS — B351 Tinea unguium: Secondary | ICD-10-CM | POA: Diagnosis not present

## 2024-08-12 MED ORDER — TERBINAFINE HCL 250 MG PO TABS: 250.0000 mg | ORAL_TABLET | Freq: Every day | ORAL | 1 refills | Status: AC

## 2024-08-12 MED ORDER — DOXYCYCLINE HYCLATE 100 MG PO TABS: 100.0000 mg | ORAL_TABLET | Freq: Two times a day (BID) | ORAL | 0 refills | Status: AC

## 2024-08-12 NOTE — Progress Notes (Signed)
 [facility] [address] [place], [place] [address]  Internal MEDICINE  Office Visit Note  Patient Name: Tara Romero  [id number]  [id number]  Date of Service: 08/12/2024  Chief Complaint  Patient presents with   Acute Visit    Right foot toenail fungus    HPI  Pt is seen for acute visit C/O toe nail discoloration for last 10 years, now getting worse  She thinks its spreading to other nails  She is c/o itching in her ears    Current Medication: Outpatient Encounter Medications as of 08/12/2024  Medication Sig   doxycycline  (VIBRA -TABS) 100 MG tablet Take 1 tablet (100 mg total) by mouth 2 (two) times daily.   terbinafine  (LAMISIL ) 250 MG tablet Take 1 tablet (250 mg total) by mouth daily.   acetaminophen  (TYLENOL ) 500 MG tablet Take 1,000 mg by mouth every 6 (six) hours as needed.   ibuprofen  (ADVIL ) 600 MG tablet Take 1 tablet (600 mg total) by mouth every 6 (six) hours.   No facility-administered encounter medications on file as of 08/12/2024.    Surgical History: Past Surgical History:  Procedure Laterality Date   CESAREAN SECTION N/A 04/08/2015   Procedure: CESAREAN SECTION;  Surgeon: Glory High, MD;  Location: [facility] OR[facility];  Service: Obstetrics;  Laterality: N/A;   CESAREAN SECTION WITH BILATERAL TUBAL LIGATION N/A 12/24/2019   Procedure: CESAREAN SECTION WITH BILATERAL SALPINGECTOMY;  Surgeon: Kandis Devaughn Sayres, MD;  Location: [facility];  Service: Obstetrics;  Laterality: N/A;   DILATION AND EVACUATION N/A 06/25/2018   Procedure: DILATATION AND EVACUATION;  Surgeon: Arloa Lamar SQUIBB, MD;  Location: [facility] OR[facility];  Service: Gynecology;  Laterality: N/A;    Medical History: Past Medical History:  Diagnosis Date   Missed ab 05/29/2018    Family History: History reviewed. No pertinent family history.  Social History   Socioeconomic History   Marital status: Single    Spouse name: Not on file   Number of children: Not on file   Years of education:  Not on file   Highest education level: Not on file  Occupational History   Not on file  Tobacco Use   Smoking status: Never   Smokeless tobacco: Never  Vaping Use   Vaping status: Never Used  Substance and Sexual Activity   Alcohol use: No   Drug use: No   Sexual activity: Yes    Partners: Male    Birth control/protection: None  Other Topics Concern   Not on file  Social History Narrative   Not on file   Social Drivers of Health   Tobacco Use: Low Risk (08/12/2024)   Patient History    Smoking Tobacco Use: Never    Smokeless Tobacco Use: Never    Passive Exposure: Not on file  Financial Resource Strain: Not on file  Food Insecurity: Not on file  Transportation Needs: Not on file  Physical Activity: Not on file  Stress: Not on file  Social Connections: Not on file  Intimate Partner Violence: Not on file  Depression (PHQ2-9): Low Risk (08/25/2021)   Depression (PHQ2-9)    PHQ-2 Score: 0  Alcohol Screen: Not on file  Housing: Not on file  Utilities: Not on file  Health Literacy: Not on file      Review of Systems  Constitutional:  Negative for fatigue and fever.  HENT:  Negative for congestion, mouth sores and postnasal drip.   Respiratory:  Negative for cough.   Cardiovascular:  Negative for chest pain.  Genitourinary:  Negative for flank pain.  Psychiatric/Behavioral: Negative.      Vital Signs: BP 124/70   Pulse 78   Temp 97.6 F (36.4 C)   Resp 16   Ht 5' 5 (1.651 m)   Wt 139 lb 6.4 oz (63.2 kg)   SpO2 96%   BMI 23.20 kg/m    Physical Exam Constitutional:      Appearance: Normal appearance.  HENT:     Head: Normocephalic and atraumatic.     Nose: Nose normal.     Mouth/Throat:     Mouth: Mucous membranes are moist.     Pharynx: No posterior oropharyngeal erythema.  Eyes:     Extraocular Movements: Extraocular movements intact.     Pupils: Pupils are equal, round, and reactive to light.  Cardiovascular:     Pulses: Normal pulses.      Heart sounds: Normal heart sounds.  Pulmonary:     Effort: Pulmonary effort is normal.     Breath sounds: Normal breath sounds.  Skin:    Comments: Discolored, fragmented toe nail ( has nail polish on) picture was seen   Neurological:     General: No focal deficit present.     Mental Status: She is alert.  Psychiatric:        Mood and Affect: Mood normal.        Behavior: Behavior normal.        Assessment/Plan: 1. Infection of nail bed of toe of right foot (Primary) Will start on abx as prescribed today, monitor response  - doxycycline  (VIBRA -TABS) 100 MG tablet; Take 1 tablet (100 mg total) by mouth 2 (two) times daily.  Dispense: 20 tablet; Refill: 0  2. Onychomycosis Long standing, chronic infection. Might need 6 months of therapy, will refill after 2 months evaluation, pt was instructed to use nail polish at next visit so proper evaluation can be done  - terbinafine  (LAMISIL ) 250 MG tablet; Take 1 tablet (250 mg total) by mouth daily.  Dispense: 30 tablet; Refill: 1  3. High risk medication use LFT's in 7 weeks  - terbinafine  (LAMISIL ) 250 MG tablet; Take 1 tablet (250 mg total) by mouth daily.  Dispense: 30 tablet; Refill: 1 - Hepatic function panel     General Counseling: Junie verbalizes understanding of the findings of todays visit and agrees with plan of treatment. I have discussed any further diagnostic evaluation that may be needed or ordered today. We also reviewed her medications today. she has been encouraged to call the office with any questions or concerns that should arise related to todays visit.    Orders Placed This Encounter  Procedures   Hepatic function panel    Meds ordered this encounter  Medications   terbinafine  (LAMISIL ) 250 MG tablet    Sig: Take 1 tablet (250 mg total) by mouth daily.    Dispense:  30 tablet    Refill:  1   doxycycline  (VIBRA -TABS) 100 MG tablet    Sig: Take 1 tablet (100 mg total) by mouth 2 (two) times daily.     Dispense:  20 tablet    Refill:  0    Total time spent:30 Minutes Time spent includes review of chart, medications, test results, and follow up plan with the patient.   [place] Controlled Substance Database was reviewed by me.   Dr Romani Wilbon M Lotoya Casella Internal medicine

## 2024-10-02 ENCOUNTER — Ambulatory Visit: Admitting: Physician Assistant
